# Patient Record
Sex: Female | Born: 1945 | Race: White | Hispanic: No | Marital: Married | Smoking: Never smoker
Health system: Southern US, Community
[De-identification: ages and names within clinical notes are randomized; demographics above are authoritative.]

## PROBLEM LIST (undated history)

## (undated) DIAGNOSIS — E785 Hyperlipidemia, unspecified: Secondary | ICD-10-CM

## (undated) HISTORY — DX: Hyperlipidemia, unspecified: E78.5

## (undated) HISTORY — PX: BACK SURGERY: SHX140

## (undated) HISTORY — PX: APPENDECTOMY: SHX54

---

## 1997-08-28 ENCOUNTER — Emergency Department (HOSPITAL_COMMUNITY): Admission: EM | Admit: 1997-08-28 | Discharge: 1997-08-28 | Payer: Self-pay | Admitting: Emergency Medicine

## 1997-10-05 ENCOUNTER — Other Ambulatory Visit: Admission: RE | Admit: 1997-10-05 | Discharge: 1997-10-05 | Payer: Self-pay | Admitting: Obstetrics and Gynecology

## 1999-01-24 ENCOUNTER — Encounter: Payer: Self-pay | Admitting: Internal Medicine

## 1999-01-24 ENCOUNTER — Encounter: Admission: RE | Admit: 1999-01-24 | Discharge: 1999-01-24 | Payer: Self-pay | Admitting: Internal Medicine

## 1999-04-14 ENCOUNTER — Other Ambulatory Visit: Admission: RE | Admit: 1999-04-14 | Discharge: 1999-04-14 | Payer: Self-pay | Admitting: Obstetrics and Gynecology

## 2000-06-12 ENCOUNTER — Ambulatory Visit (HOSPITAL_COMMUNITY): Admission: RE | Admit: 2000-06-12 | Discharge: 2000-06-12 | Payer: Self-pay | Admitting: Gastroenterology

## 2001-01-11 ENCOUNTER — Other Ambulatory Visit: Admission: RE | Admit: 2001-01-11 | Discharge: 2001-01-11 | Payer: Self-pay | Admitting: Obstetrics and Gynecology

## 2002-01-09 HISTORY — PX: OTHER SURGICAL HISTORY: SHX169

## 2004-03-04 ENCOUNTER — Ambulatory Visit: Payer: Self-pay

## 2005-02-09 ENCOUNTER — Encounter: Admission: RE | Admit: 2005-02-09 | Discharge: 2005-02-09 | Payer: Self-pay | Admitting: Orthopedic Surgery

## 2007-12-29 ENCOUNTER — Emergency Department (HOSPITAL_COMMUNITY): Admission: EM | Admit: 2007-12-29 | Discharge: 2007-12-29 | Payer: Self-pay | Admitting: Emergency Medicine

## 2011-02-15 ENCOUNTER — Ambulatory Visit (INDEPENDENT_AMBULATORY_CARE_PROVIDER_SITE_OTHER): Payer: BC Managed Care – PPO | Admitting: Cardiology

## 2011-02-15 ENCOUNTER — Encounter: Payer: Self-pay | Admitting: Cardiology

## 2011-02-15 DIAGNOSIS — I10 Essential (primary) hypertension: Secondary | ICD-10-CM | POA: Insufficient documentation

## 2011-02-15 DIAGNOSIS — E785 Hyperlipidemia, unspecified: Secondary | ICD-10-CM | POA: Insufficient documentation

## 2011-02-15 DIAGNOSIS — R011 Cardiac murmur, unspecified: Secondary | ICD-10-CM | POA: Insufficient documentation

## 2011-02-15 DIAGNOSIS — R079 Chest pain, unspecified: Secondary | ICD-10-CM | POA: Insufficient documentation

## 2011-02-15 MED ORDER — CHLORTHALIDONE 25 MG PO TABS: ORAL_TABLET | ORAL | Status: DC

## 2011-02-15 MED ORDER — POTASSIUM CHLORIDE CRYS ER 20 MEQ PO TBCR: 20.0000 meq | EXTENDED_RELEASE_TABLET | Freq: Every day | ORAL | Status: DC

## 2011-02-15 NOTE — Assessment & Plan Note (Addendum)
Patient has atypical chest pain, but she has a number of risk factors for CAD including strong family history of premature CAD, diabetes since age 66, HTN, and hyperlipidemia.  She also has been using premarin for years.  Given these factors, I think that she should have an ETT-myoview for risk stratification.  I will set this up today.  Continue ASA 81 mg daily.

## 2011-02-15 NOTE — Assessment & Plan Note (Signed)
BP quite high today, has been high at home.  Unable to increase losartan to 50 bid because of fatigue.  She is taking 25 mg qam and 50 mg qpm.  I am going to add chlorthalidone 12.5 mg daily + 20 mEq KCl to her regimen.  She will record her BP and I will reassess this when I see her in 2 wks.  Will get BMET in 2 wks.

## 2011-02-15 NOTE — Assessment & Plan Note (Signed)
Patient has an aortic area murmur.  It does not sound like severe AS.  She says she has been told for a long time that she has a murmur and apparently had an echo at some point in the past.  I cannot find one in the system.  I will get an echo to assess for significant valvular disease.

## 2011-02-15 NOTE — Patient Instructions (Addendum)
Start chlorthalidone 12.5mg  daily. This will be one-half 25mg  tablet daily.  Start KCL(potassium) 20 mEq daily.  Your physician has requested that you have en exercise stress myoview. For further information please visit [email or web address] Please follow instruction sheet, as given.   Your physician has requested that you have an echocardiogram. Echocardiography is a painless test that uses sound waves to create images of your heart. It provides your doctor with information about the size and shape of your heart and how well your heart's chambers and valves are working. This procedure takes approximately one hour. There are no restrictions for this procedure.  Take and record your blood pressure and heart rate daily. Bring these readings when you come back to see Dr Shirlee Latch in 2-3 weeks.  Your physician recommends that you schedule a follow-up appointment in: 2-3 weeks with Dr Shirlee Latch.

## 2011-02-15 NOTE — Progress Notes (Signed)
PCP: Dr. Jacky Kindle  66 yo with history of type I diabetes, HTN, hyperlipidemia presents for evaluation of chest pain and HTN.  Patient has had type I diabetes since she was 22.  She had atypical chest pain around 2000 and actually had a left heart cath at that time.  I am unable to find this in the medical record but she says that it was normal.  Starting in December, patient has been having episodes of lower central chest burning radiating to her back.  Episodes last about 5 minutes at at time.  They are not related to exertion and seem to track with stress.  She was having frequent episodes around Christmas when family was all in town.  She also reports left upper arm aching "like a toothache" that actually seems to be related to sleeping position.  She exercises with a trainer twice a week, doing treadmill and weights.  No exertional chest pain, no exertional dyspnea.    Blood pressure has been running high recently.  It is 193/85 today and has been systolic in the 150s when she measures it at home.  BP has been higher over the last couple of months.  She was told to increase losartan from 50 mg daily to 50 mg bid, but had significantly increased fatigue with higher losartan so is taking it 25 qam, 50 qpm.    ECG: NSR at 97, left atrial enlargement, Qs in V1 and V2 may be normal variant.   PMH: 1. Type I diabetes since age 83.  2. HTN: Cough with ACEI 3. Hyperlipidemia 4. GERD 5. C-spine arthritis 6. History of left heart cath around 2000.  Per patient's report, it was normal.   SH: Married, 2 children.  No smoking.  Occasional ETOH.  Lives in [place].   FH: Father with MI at 38, sister with CAD beginning late 11s, mother with CABG in mid-70s and CVA.   ROS: All systems reviewed and negative except as per HPI.   Current Outpatient Prescriptions  Medication Sig Dispense Refill  . aspirin 81 MG tablet Take 160 mg by mouth daily.      Marland Kitchen estrogens, conjugated, (PREMARIN) 0.625 MG tablet Take  0.625 mg by mouth daily. Take daily for 21 days then do not take for 7 days.      . insulin glargine (LANTUS) 100 UNIT/ML injection Inject into the skin at bedtime. 12-14 units in the morning      . insulin NPH (HUMULIN N,NOVOLIN N) 100 UNIT/ML injection Inject into the skin. 5 units every morning      . insulin regular (HUMULIN R) 100 units/mL injection Inject into the skin 3 (three) times daily before meals. 8-9 unints at night 1 u it all depends      . losartan (COZAAR) 50 MG tablet Take 50 mg by mouth daily. 1 and 1/2 tab daily      . Multiple Vitamin (MULTI-VITAMIN PO) Take by mouth daily.      . NON FORMULARY Glucagon Emergency Kit 1 MG kit      . simvastatin (ZOCOR) 20 MG tablet Take 20 mg by mouth every evening.      . chlorthalidone (HYGROTON) 25 MG tablet Take 1/2 tablet daily  30 tablet  3  . potassium chloride SA (K-DUR,KLOR-CON) 20 MEQ tablet Take 1 tablet (20 mEq total) by mouth daily.  30 tablet  6    BP 193/85  Pulse 102  Ht 5\' 2"  (1.575 m)  Wt 67.187 kg (148  lb 1.9 oz)  BMI 27.09 kg/m2 General: NAD Neck: No JVD, no thyromegaly or thyroid nodule.  Lungs: Clear to auscultation bilaterally with normal respiratory effort. CV: Nondisplaced PMI.  Heart regular S1/S2, no S3/S4, 2/6 SEM RUSB.  No peripheral edema.  No carotid bruit.  Normal pedal pulses.  Abdomen: Soft, nontender, no hepatosplenomegaly, no distention.  Skin: Intact without lesions or rashes.  Neurologic: Alert and oriented x 3.  Psych: Normal affect. Extremities: No clubbing or cyanosis.  HEENT: Normal.

## 2011-02-15 NOTE — Assessment & Plan Note (Signed)
Would aim for LDL < 100 ideally with her vascular disease risk.

## 2011-02-22 ENCOUNTER — Other Ambulatory Visit: Payer: Self-pay

## 2011-02-22 ENCOUNTER — Ambulatory Visit (HOSPITAL_COMMUNITY): Payer: BC Managed Care – PPO | Attending: Cardiovascular Disease | Admitting: Radiology

## 2011-02-22 DIAGNOSIS — I10 Essential (primary) hypertension: Secondary | ICD-10-CM | POA: Insufficient documentation

## 2011-02-22 DIAGNOSIS — Z8249 Family history of ischemic heart disease and other diseases of the circulatory system: Secondary | ICD-10-CM | POA: Insufficient documentation

## 2011-02-22 DIAGNOSIS — Z87891 Personal history of nicotine dependence: Secondary | ICD-10-CM | POA: Insufficient documentation

## 2011-02-22 DIAGNOSIS — E109 Type 1 diabetes mellitus without complications: Secondary | ICD-10-CM | POA: Insufficient documentation

## 2011-02-22 DIAGNOSIS — E785 Hyperlipidemia, unspecified: Secondary | ICD-10-CM | POA: Insufficient documentation

## 2011-02-22 DIAGNOSIS — M25519 Pain in unspecified shoulder: Secondary | ICD-10-CM | POA: Insufficient documentation

## 2011-02-22 DIAGNOSIS — R011 Cardiac murmur, unspecified: Secondary | ICD-10-CM | POA: Insufficient documentation

## 2011-03-01 ENCOUNTER — Ambulatory Visit (HOSPITAL_COMMUNITY): Payer: BC Managed Care – PPO | Attending: Cardiovascular Disease | Admitting: Radiology

## 2011-03-01 DIAGNOSIS — Z794 Long term (current) use of insulin: Secondary | ICD-10-CM | POA: Insufficient documentation

## 2011-03-01 DIAGNOSIS — I10 Essential (primary) hypertension: Secondary | ICD-10-CM | POA: Insufficient documentation

## 2011-03-01 DIAGNOSIS — E119 Type 2 diabetes mellitus without complications: Secondary | ICD-10-CM | POA: Insufficient documentation

## 2011-03-01 DIAGNOSIS — Z8249 Family history of ischemic heart disease and other diseases of the circulatory system: Secondary | ICD-10-CM | POA: Insufficient documentation

## 2011-03-01 DIAGNOSIS — R079 Chest pain, unspecified: Secondary | ICD-10-CM | POA: Insufficient documentation

## 2011-03-01 MED ORDER — TECHNETIUM TC 99M TETROFOSMIN IV KIT
30.0000 | PACK | Freq: Once | INTRAVENOUS | Status: AC | PRN
  Administered 2011-03-01: 30 via INTRAVENOUS

## 2011-03-01 MED ORDER — TECHNETIUM TC 99M TETROFOSMIN IV KIT
10.0000 | PACK | Freq: Once | INTRAVENOUS | Status: AC | PRN
  Administered 2011-03-01: 10 via INTRAVENOUS

## 2011-03-01 NOTE — Progress Notes (Signed)
[facility] SITE 3 NUCLEAR MED [address] [place] [address] [phone]  Cardiology Nuclear Med Study  Pamela Warner is a 66 y.o. female [id number] 1945-06-18   Nuclear Med Background Indication for Stress Test:  Evaluation for Ischemia History: '00 Heart Catheterization: NL per pt, '06 MPS: EF: 81% NL, 02/13 ECHO: EF: 55-60% Cardiac Risk Factors: Family History - CAD, Hypertension and IDDM Type 2  Symptoms:  Chest Pain   Nuclear Pre-Procedure Caffeine/Decaff Intake:  None NPO After: 7:30am   Lungs:  clear IV 0.9% NS with Angio Cath:  22g  IV Site: L Antecubital  IV Started by:  Stanton Kidney, EMT-P  Chest Size (in):  34 Cup Size: C  Height: 5\' 2"  (1.575 m)  Weight:  145 lb (65.772 kg)  BMI:  Body mass index is 26.52 kg/(m^2). Tech Comments:  Meds were taken as directed.    Nuclear Med Study 1 or 2 day study: 1 day  Stress Test Type:  Stress  Reading MD: Charlton Haws, MD  Order Authorizing Provider:  D.McLean  Resting Radionuclide: Technetium 15m Tetrofosmin  Resting Radionuclide Dose: 11.0 mCi   Stress Radionuclide:  Technetium 68m Tetrofosmin  Stress Radionuclide Dose: 33.0 mCi           Stress Protocol Rest HR: 75 Stress HR: 142  Rest BP: 152/58 Stress BP: 212/77  Exercise Time (min): 5:59 METS: 7.0   Predicted Max HR: 156 bpm % Max HR: 91.03 bpm Rate Pressure Product: 21308   Dose of Adenosine (mg):  n/a Dose of Lexiscan: n/a mg  Dose of Atropine (mg): n/a Dose of Dobutamine: n/a mcg/kg/min (at max HR)  Stress Test Technologist: Milana Na, EMT-P  Nuclear Technologist:  Doyne Keel, CNMT     Rest Procedure:  Myocardial perfusion imaging was performed at rest 45 minutes following the intravenous administration of Technetium 19m Tetrofosmin. Rest ECG: NSR  Stress Procedure:  The patient exercised for 5:59.  The patient stopped due to fatigue and denied any chest pain.  There were + significant ST-T wave changes.  Technetium  64m Tetrofosmin was injected at peak exercise and myocardial perfusion imaging was performed after a brief delay. Stress ECG: Markedly positive with 2 mm ST segment depression in inferolateral leads and T wave inversions in recovery  QPS Raw Data Images:  Normal; no motion artifact; normal heart/lung ratio. Stress Images:  Normal homogeneous uptake in all areas of the myocardium. Rest Images:  Normal homogeneous uptake in all areas of the myocardium. Subtraction (SDS):  Normal Transient Ischemic Dilatation (Normal <1.22):  1.04 Lung/Heart Ratio (Normal <0.45):  0.27  Quantitative Gated Spect Images QGS EDV:  51 ml QGS ESV:  13 ml QGS cine images:  NL LV Function; NL Wall Motion QGS EF: 73%  Impression Exercise Capacity:  Fair exercise capacity. BP Response:  Hypertensive blood pressure response. Clinical Symptoms:  No chest pain. ECG Impression:  Significant ST abnormalities consistent with ischemia. Comparison with Prior Nuclear Study: No images to compare  Overall Impression:  Normal nuclear images with markedly positive ECG changes with exercise.  EF normal 73%   Charlton Haws

## 2011-03-02 ENCOUNTER — Encounter: Payer: Self-pay | Admitting: Cardiology

## 2011-03-02 ENCOUNTER — Ambulatory Visit (INDEPENDENT_AMBULATORY_CARE_PROVIDER_SITE_OTHER): Payer: BC Managed Care – PPO | Admitting: Cardiology

## 2011-03-02 DIAGNOSIS — R011 Cardiac murmur, unspecified: Secondary | ICD-10-CM

## 2011-03-02 DIAGNOSIS — I10 Essential (primary) hypertension: Secondary | ICD-10-CM

## 2011-03-02 DIAGNOSIS — E785 Hyperlipidemia, unspecified: Secondary | ICD-10-CM

## 2011-03-02 DIAGNOSIS — R079 Chest pain, unspecified: Secondary | ICD-10-CM

## 2011-03-02 LAB — BASIC METABOLIC PANEL
BUN: 17 mg/dL (ref 6–23)
Chloride: 99 mEq/L (ref 96–112)
Potassium: 3.8 mEq/L (ref 3.5–5.1)
Sodium: 137 mEq/L (ref 135–145)

## 2011-03-02 MED ORDER — CHLORTHALIDONE 25 MG PO TABS: 25.0000 mg | ORAL_TABLET | Freq: Every day | ORAL | Status: DC

## 2011-03-02 NOTE — Progress Notes (Signed)
PCP: Dr. Jacky Kindle  66 yo with history of type I diabetes, HTN, hyperlipidemia presented for evaluation of chest pain and HTN.  Patient has had type I diabetes since she was 22.  She had atypical chest pain around 2000 and actually had a left heart cath at that time.  I am unable to find this in the medical record but she says that it was normal.  Starting in December, patient has been having episodes of lower central chest burning radiating to her back.  Episodes last about 5 minutes at at time.  They are not related to exertion and seem to track with stress.  She was having frequent episodes around Christmas when family was all in town.  She also reports left upper arm aching "like a toothache" that actually seems to be related to sleeping position.  She exercises with a trainer twice a week, doing treadmill and weights.  No exertional chest pain, no exertional dyspnea.    Given her very strong family history of premature CAD, I had her do an ETT-myoview.  The perfusion images showed no ischemia or infarction, but the ECG was markedly abnormal with ST depression.  No chest pain.    At last appointment, I started her on chlorthalidone 12.5 mg daily (had had trouble with side effects when she tried increasing losartan farther).  SBP is running in the 120s-140s at home.  BP is 175/84 today.   PMH: 1. Type I diabetes since age 76.  2. HTN: Cough with ACEI 3. Hyperlipidemia 4. GERD 5. C-spine arthritis 6. History of left heart cath around 2000.  Per patient's report, it was normal.  ETT-myoview (2/13) with 5'59" exercise, stopping due to fatigue (she says she could have gone further), no evidence for ischemia or infarction by perfusion images but markedly positive ECG changes with exercise.   7. Echo (2/13): EF 55-60%, grade I diastolic dysfunction, PA systolic pressure 32 mmHg.   SH: Married, 2 children.  No smoking.  Occasional ETOH.  Lives in [place].   FH: Father with MI at 69, sister with CAD  beginning late 61s, mother with CABG in mid-70s and CVA.   ROS: All systems reviewed and negative except as per HPI.   Current Outpatient Prescriptions  Medication Sig Dispense Refill  . aspirin 81 MG tablet Take 160 mg by mouth daily.      Marland Kitchen estrogens, conjugated, (PREMARIN) 0.625 MG tablet Take 0.625 mg by mouth daily. Take daily for 21 days then do not take for 7 days.      Marland Kitchen GLUCAGON EMERGENCY 1 MG injection as directed.      . insulin glargine (LANTUS) 100 UNIT/ML injection Inject into the skin at bedtime. 12-14 units in the morning      . insulin NPH (HUMULIN N,NOVOLIN N) 100 UNIT/ML injection Inject into the skin. 5 units every morning      . insulin regular (HUMULIN R) 100 units/mL injection Inject into the skin 3 (three) times daily before meals. 8-9 unints at night 1 u it all depends      . losartan (COZAAR) 50 MG tablet Take 50 mg by mouth daily. 1 and 1/2 tab daily      . Multiple Vitamin (MULTI-VITAMIN PO) Take by mouth daily.      . potassium chloride SA (K-DUR,KLOR-CON) 20 MEQ tablet Take 1 tablet (20 mEq total) by mouth daily.  30 tablet  6  . simvastatin (ZOCOR) 20 MG tablet Take 20 mg by mouth every evening.      Marland Kitchen  DISCONTD: chlorthalidone (HYGROTON) 25 MG tablet Take 1/2 tablet daily  30 tablet  3  . chlorthalidone (HYGROTON) 25 MG tablet Take 1 tablet (25 mg total) by mouth daily.  30 tablet  6    BP 175/84  Pulse 89  Ht 5\' 2"  (1.575 m)  Wt 147 lb (66.679 kg)  BMI 26.89 kg/m2 General: NAD Neck: No JVD, no thyromegaly or thyroid nodule.  Lungs: Clear to auscultation bilaterally with normal respiratory effort. CV: Nondisplaced PMI.  Heart regular S1/S2, no S3/S4, 1/6 SEM RUSB.  No peripheral edema.  No carotid bruit.  Normal pedal pulses.  Abdomen: Soft, nontender, no hepatosplenomegaly, no distention.  Neurologic: Alert and oriented x 3.  Psych: Normal affect. Extremities: No clubbing or cyanosis.

## 2011-03-02 NOTE — Patient Instructions (Signed)
Increase chlorthalidone to 25mg  daily.  Your physician recommends that you have lab work today--BMET   Your physician recommends that you return for a FASTING lipid profile/ BMET in 1-2 weeks.   Your physician wants you to follow-up in: 6 months with Dr Shirlee Latch. (August 2013). You will receive a reminder letter in the mail two months in advance. If you don't receive a letter, please call our office to schedule the follow-up appointment.

## 2011-03-02 NOTE — Assessment & Plan Note (Signed)
BP still running high.  Increase chlorthalidone to 25 mg daily with BMET in 2 weeks.

## 2011-03-02 NOTE — Assessment & Plan Note (Signed)
Suspect Pamela Warner's RUSB systolic murmur is a benign flow murmur.  Echo showed no significant valvular abnormality.

## 2011-03-02 NOTE — Assessment & Plan Note (Signed)
Check lipids, goal LDL at least < 100.

## 2011-03-02 NOTE — Assessment & Plan Note (Signed)
Patient has atypical chest pain, but she has a number of risk factors for CAD including strong family history of premature CAD, diabetes since age 66, HTN, and hyperlipidemia.  She also has been using premarin for years.  Given these factors, I had her get an ETT-myoview for risk stratification.  The perfusion images showed no evidence for ischemia or infarction but the ECG was suggestive of ischemia.  The myoview images should be more accurate, but given her risk factors, I am concerned about the abnormal ECG.  It is possible that this represents microvascular ischemia.  She is certainly at risk for this with her history of longstanding DM.  We had a long discussion today about what to do with this information.  A coronary CT angiogram to directly visualize the coronary arteries would be reasonable.  For now, we decided to continue aggressive management of risk factors given her lack of exertional symptoms.  She will continue ASA.  I would like to see her LDL at least < 100.  Finally, she needs good BP control.

## 2011-03-09 ENCOUNTER — Other Ambulatory Visit: Payer: BC Managed Care – PPO

## 2011-03-14 ENCOUNTER — Other Ambulatory Visit (INDEPENDENT_AMBULATORY_CARE_PROVIDER_SITE_OTHER): Payer: BC Managed Care – PPO

## 2011-03-14 ENCOUNTER — Telehealth: Payer: Self-pay | Admitting: *Deleted

## 2011-03-14 DIAGNOSIS — I10 Essential (primary) hypertension: Secondary | ICD-10-CM

## 2011-03-14 DIAGNOSIS — E876 Hypokalemia: Secondary | ICD-10-CM

## 2011-03-14 DIAGNOSIS — R079 Chest pain, unspecified: Secondary | ICD-10-CM

## 2011-03-14 LAB — BASIC METABOLIC PANEL
BUN: 14 mg/dL (ref 6–23)
Calcium: 9.3 mg/dL (ref 8.4–10.5)
Creatinine, Ser: 0.7 mg/dL (ref 0.4–1.2)
GFR: 87.93 mL/min (ref >60.00)
Glucose, Bld: 269 mg/dL — ABNORMAL HIGH (ref 70–99)

## 2011-03-14 LAB — LIPID PANEL
Cholesterol: 161 mg/dL (ref 0–200)
HDL: 65.5 mg/dL (ref >39.00)
LDL Cholesterol: 73 mg/dL (ref 0–99)
Triglycerides: 115 mg/dL (ref 0.0–149.0)
VLDL: 23 mg/dL (ref 0.0–40.0)

## 2011-03-14 MED ORDER — POTASSIUM CHLORIDE CRYS ER 20 MEQ PO TBCR: 20.0000 meq | EXTENDED_RELEASE_TABLET | Freq: Two times a day (BID) | ORAL | Status: DC

## 2011-03-14 NOTE — Telephone Encounter (Signed)
Notes Recorded by Jacqlyn Krauss, RN on 03/14/2011 at 6:23 PM Discussed with pt. She is currently taking KCL 20 mEq daily. She will increase KCL to 20 mEq bid. She will return for a BMET 02/28/11. ------  Notes Recorded by Marca Ancona, MD on 03/14/2011 at 5:33 PM Good lipids, increase KCl by 20 mEq daily and repeat BMET in 2 wks.

## 2011-03-28 ENCOUNTER — Other Ambulatory Visit: Payer: BC Managed Care – PPO

## 2011-04-28 ENCOUNTER — Other Ambulatory Visit: Payer: Self-pay | Admitting: *Deleted

## 2011-04-28 DIAGNOSIS — I10 Essential (primary) hypertension: Secondary | ICD-10-CM

## 2011-04-28 DIAGNOSIS — R079 Chest pain, unspecified: Secondary | ICD-10-CM

## 2011-04-28 MED ORDER — CHLORTHALIDONE 25 MG PO TABS: 25.0000 mg | ORAL_TABLET | Freq: Every day | ORAL | Status: DC

## 2011-04-28 NOTE — Telephone Encounter (Signed)
Refilled chlorthalidone

## 2011-05-15 ENCOUNTER — Other Ambulatory Visit: Payer: Self-pay | Admitting: *Deleted

## 2011-05-15 DIAGNOSIS — E876 Hypokalemia: Secondary | ICD-10-CM

## 2011-05-15 MED ORDER — POTASSIUM CHLORIDE CRYS ER 20 MEQ PO TBCR: 20.0000 meq | EXTENDED_RELEASE_TABLET | Freq: Two times a day (BID) | ORAL | Status: AC

## 2011-05-15 NOTE — Telephone Encounter (Signed)
Refilled potassium, for 90 day supply

## 2011-08-18 DIAGNOSIS — E785 Hyperlipidemia, unspecified: Secondary | ICD-10-CM | POA: Diagnosis not present

## 2011-08-18 DIAGNOSIS — I10 Essential (primary) hypertension: Secondary | ICD-10-CM | POA: Diagnosis not present

## 2011-08-18 DIAGNOSIS — K219 Gastro-esophageal reflux disease without esophagitis: Secondary | ICD-10-CM | POA: Diagnosis not present

## 2011-08-18 DIAGNOSIS — E109 Type 1 diabetes mellitus without complications: Secondary | ICD-10-CM | POA: Diagnosis not present

## 2011-08-24 DIAGNOSIS — Z803 Family history of malignant neoplasm of breast: Secondary | ICD-10-CM | POA: Diagnosis not present

## 2011-08-24 DIAGNOSIS — Z1231 Encounter for screening mammogram for malignant neoplasm of breast: Secondary | ICD-10-CM | POA: Diagnosis not present

## 2011-08-24 DIAGNOSIS — R928 Other abnormal and inconclusive findings on diagnostic imaging of breast: Secondary | ICD-10-CM | POA: Diagnosis not present

## 2011-08-28 DIAGNOSIS — R928 Other abnormal and inconclusive findings on diagnostic imaging of breast: Secondary | ICD-10-CM | POA: Diagnosis not present

## 2011-08-28 DIAGNOSIS — Z1231 Encounter for screening mammogram for malignant neoplasm of breast: Secondary | ICD-10-CM | POA: Diagnosis not present

## 2011-08-28 DIAGNOSIS — Z803 Family history of malignant neoplasm of breast: Secondary | ICD-10-CM | POA: Diagnosis not present

## 2011-09-14 DIAGNOSIS — L821 Other seborrheic keratosis: Secondary | ICD-10-CM | POA: Diagnosis not present

## 2011-09-14 DIAGNOSIS — T148 Other injury of unspecified body region: Secondary | ICD-10-CM | POA: Diagnosis not present

## 2011-09-14 DIAGNOSIS — W57XXXA Bitten or stung by nonvenomous insect and other nonvenomous arthropods, initial encounter: Secondary | ICD-10-CM | POA: Diagnosis not present

## 2011-10-17 DIAGNOSIS — I10 Essential (primary) hypertension: Secondary | ICD-10-CM | POA: Diagnosis not present

## 2011-11-09 ENCOUNTER — Telehealth: Payer: Self-pay | Admitting: Cardiology

## 2011-11-09 NOTE — Telephone Encounter (Signed)
New Problem:     I called the patient and was unable to reach them. I left a message on their voicemail with my name, the reason I called, the name of his physician, and a number to call back to schedule their appointment. 

## 2011-12-05 DIAGNOSIS — Z85828 Personal history of other malignant neoplasm of skin: Secondary | ICD-10-CM | POA: Diagnosis not present

## 2011-12-05 DIAGNOSIS — W57XXXA Bitten or stung by nonvenomous insect and other nonvenomous arthropods, initial encounter: Secondary | ICD-10-CM | POA: Diagnosis not present

## 2011-12-05 DIAGNOSIS — L821 Other seborrheic keratosis: Secondary | ICD-10-CM | POA: Diagnosis not present

## 2011-12-05 DIAGNOSIS — T148 Other injury of unspecified body region: Secondary | ICD-10-CM | POA: Diagnosis not present

## 2011-12-15 ENCOUNTER — Emergency Department (HOSPITAL_COMMUNITY)
Admission: EM | Admit: 2011-12-15 | Discharge: 2011-12-16 | Disposition: A | Discharge status: Home / Self Care | Payer: Medicare Other | Attending: Emergency Medicine | Admitting: Emergency Medicine

## 2011-12-15 ENCOUNTER — Emergency Department (HOSPITAL_COMMUNITY): Payer: Medicare Other

## 2011-12-15 ENCOUNTER — Encounter (HOSPITAL_COMMUNITY): Payer: Self-pay | Admitting: Emergency Medicine

## 2011-12-15 DIAGNOSIS — E785 Hyperlipidemia, unspecified: Secondary | ICD-10-CM | POA: Diagnosis not present

## 2011-12-15 DIAGNOSIS — Z7982 Long term (current) use of aspirin: Secondary | ICD-10-CM | POA: Insufficient documentation

## 2011-12-15 DIAGNOSIS — Z794 Long term (current) use of insulin: Secondary | ICD-10-CM | POA: Diagnosis not present

## 2011-12-15 DIAGNOSIS — I10 Essential (primary) hypertension: Secondary | ICD-10-CM | POA: Insufficient documentation

## 2011-12-15 DIAGNOSIS — Z79899 Other long term (current) drug therapy: Secondary | ICD-10-CM | POA: Diagnosis not present

## 2011-12-15 DIAGNOSIS — R112 Nausea with vomiting, unspecified: Secondary | ICD-10-CM | POA: Diagnosis not present

## 2011-12-15 DIAGNOSIS — E109 Type 1 diabetes mellitus without complications: Secondary | ICD-10-CM | POA: Diagnosis not present

## 2011-12-15 DIAGNOSIS — R51 Headache: Secondary | ICD-10-CM | POA: Insufficient documentation

## 2011-12-15 DIAGNOSIS — R1110 Vomiting, unspecified: Secondary | ICD-10-CM | POA: Diagnosis not present

## 2011-12-15 DIAGNOSIS — R52 Pain, unspecified: Secondary | ICD-10-CM | POA: Diagnosis not present

## 2011-12-15 LAB — BASIC METABOLIC PANEL
BUN: 16 mg/dL (ref 6–23)
CO2: 23 mEq/L (ref 19–32)
Calcium: 9.6 mg/dL (ref 8.4–10.5)
Chloride: 98 mEq/L (ref 96–112)
Creatinine, Ser: 0.6 mg/dL (ref 0.50–1.10)
GFR calc Af Amer: >90 mL/min (ref >90)
GFR calc non Af Amer: >90 mL/min (ref >90)
Glucose, Bld: 128 mg/dL — ABNORMAL HIGH (ref 70–99)
Potassium: 3.1 mEq/L — ABNORMAL LOW (ref 3.5–5.1)
Sodium: 135 mEq/L (ref 135–145)

## 2011-12-15 LAB — URINALYSIS, ROUTINE W REFLEX MICROSCOPIC
Bilirubin Urine: NEGATIVE
Glucose, UA: NEGATIVE mg/dL
Ketones, ur: 15 mg/dL — AB
Leukocytes, UA: NEGATIVE
Nitrite: NEGATIVE
Protein, ur: NEGATIVE mg/dL
Specific Gravity, Urine: 1.016 (ref 1.005–1.030)
Urobilinogen, UA: 0.2 mg/dL (ref 0.0–1.0)
pH: 8.5 — ABNORMAL HIGH (ref 5.0–8.0)

## 2011-12-15 LAB — CBC
HCT: 36.7 % (ref 36.0–46.0)
Hemoglobin: 12.7 g/dL (ref 12.0–15.0)
MCH: 29 pg (ref 26.0–34.0)
MCHC: 34.6 g/dL (ref 30.0–36.0)
MCV: 83.8 fL (ref 78.0–100.0)
Platelets: 270 10*3/uL (ref 150–400)
RBC: 4.38 MIL/uL (ref 3.87–5.11)
RDW: 11.7 % (ref 11.5–15.5)
WBC: 24.1 10*3/uL — ABNORMAL HIGH (ref 4.0–10.5)

## 2011-12-15 LAB — URINE MICROSCOPIC-ADD ON

## 2011-12-15 LAB — GLUCOSE, CAPILLARY: Glucose-Capillary: 74 mg/dL (ref 70–99)

## 2011-12-15 MED ORDER — SODIUM CHLORIDE 0.9 % IV BOLUS (SEPSIS)
1000.0000 mL | Freq: Once | INTRAVENOUS | Status: AC
  Administered 2011-12-15: 1000 mL via INTRAVENOUS

## 2011-12-15 MED ORDER — ONDANSETRON HCL 4 MG/2ML IJ SOLN
4.0000 mg | Freq: Once | INTRAMUSCULAR | Status: AC
Start: 2011-12-15 — End: 2011-12-15
  Administered 2011-12-15: 4 mg via INTRAVENOUS
  Filled 2011-12-15: qty 2

## 2011-12-15 MED ORDER — SODIUM BICARBONATE 8.4 % IV SOLN: 50.0000 meq | Freq: Once | INTRAVENOUS | Status: DC

## 2011-12-15 MED ORDER — HYDROMORPHONE HCL PF 1 MG/ML IJ SOLN
0.5000 mg | Freq: Once | INTRAMUSCULAR | Status: AC
  Administered 2011-12-15: 0.5 mg via INTRAVENOUS
  Filled 2011-12-15: qty 1

## 2011-12-15 MED ORDER — SODIUM CHLORIDE 0.9 % IV SOLN: 1.0000 g | Freq: Once | INTRAVENOUS | Status: DC

## 2011-12-15 NOTE — ED Notes (Signed)
Pt on stretcher in CT hallway resting with eyes closed, not yelling out, no crying. RN at bedside. Pt responds to verbal stimuli.

## 2011-12-15 NOTE — ED Notes (Signed)
Pt sitting up eating graham crackers and coke.  CBG 74 and pt states her blood sugar usually drops quickly.  Pt states she is feeling much better than before.  Rates pain 5/10.  Husband remains at bedside. LP consent signed and on chart.

## 2011-12-15 NOTE — ED Notes (Signed)
Pt sleeping after Dilaudid IV.  Easy to arouse. O2 sats dropped.  Pt placed on [place] at 2 liters/min.  Vitals WNL.  Family member at bedside.  Will continue to monitor.

## 2011-12-15 NOTE — ED Notes (Signed)
Splinting h/a. Husband called her and incoherent; rn friend  Told to give glucagon, and cbg afterwards was 104. When ems got there pt. Had vomiting, 10/10 h/a, given 4 mg zofran; 20 ga in lt. A/c. Nsr. ems cbg: 167. Stroke screen negative.

## 2011-12-15 NOTE — ED Provider Notes (Signed)
History    66 year old female with headache and altered mental status. Most of the history is obtained from the patient's husband. Husband last saw patient normal at approximately 1:15 this afternoon before he left to go hunting in [place]. He received call around 4:00 in the afternoon from the patient's friend who came to visit her. She stated that the patient seemed confused and was complaining of a headache. Patient has a history of insulin-dependent diabetes. Patient's husband instructed her friend to give pt a glucagon shot and he came home. He found his wife to be confused and complaining of a headache and vomiting.  No trauma that he is aware of. Patient with no significant headache history. She is not on blood thinning medications aside from 81 mg of aspirin daily. No fever. CBG for EMS and the 100s. Patient with no complaints aside from severe headache and vomiting. She denies any neck pain or neck stiffness. No numbness, tingling or loss of strength. No visual complaints.   CSN: [id number]  Arrival date & time 12/15/11  1821   First MD Initiated Contact with Patient 12/15/11 1823      Chief Complaint  Patient presents with  . Altered Mental Status  . Emesis    (Consider location/radiation/quality/duration/timing/severity/associated sxs/prior treatment) HPI  Past Medical History  Diagnosis Date  . Diabetes mellitus     type 1  . Essential hypertension   . Hyperlipidemia     Past Surgical History  Procedure Date  . Cesarean section     x2  . Appendectomy   . Other surgical history 2004    cardiolite    No family history on file.  History  Substance Use Topics  . Smoking status: Never Smoker   . Smokeless tobacco: Not on file  . Alcohol Use: Yes    OB History    Grav Para Term Preterm Abortions TAB SAB Ect Mult Living                  Review of Systems   Review of symptoms negative unless otherwise noted in HPI.   Allergies  Review of patient's  allergies indicates no known allergies.  Home Medications   Current Outpatient Rx  Name  Route  Sig  Dispense  Refill  . ASPIRIN 81 MG PO TABS   Oral   Take 160 mg by mouth daily.         . CHLORTHALIDONE 25 MG PO TABS   Oral   Take 1 tablet (25 mg total) by mouth daily.   90 tablet   3   . ESTROGENS CONJUGATED 0.625 MG PO TABS   Oral   Take 0.625 mg by mouth daily. Take daily for 21 days then do not take for 7 days.         Marland Kitchen GLUCAGON EMERGENCY 1 MG IJ KIT      as directed.         . INSULIN GLARGINE 100 UNIT/ML [place] SOLN   Subcutaneous   Inject into the skin at bedtime. 12-14 units in the morning         . INSULIN ISOPHANE HUMAN 100 UNIT/ML [place] SUSP   Subcutaneous   Inject into the skin. 5 units every morning         . INSULIN REGULAR HUMAN 100 UNIT/ML IJ SOLN   Subcutaneous   Inject into the skin 3 (three) times daily before meals. 8-9 unints at night 1 u it all depends         .  LOSARTAN POTASSIUM 50 MG PO TABS   Oral   Take 50 mg by mouth daily. 1 and 1/2 tab daily         . MULTI-VITAMIN PO   Oral   Take by mouth daily.         Marland Kitchen POTASSIUM CHLORIDE CRYS ER 20 MEQ PO TBCR   Oral   Take 1 tablet (20 mEq total) by mouth 2 (two) times daily.   180 tablet   3   . SIMVASTATIN 20 MG PO TABS   Oral   Take 20 mg by mouth every evening.           SpO2 100%  Physical Exam  Nursing note and vitals reviewed. Constitutional: She is oriented to person, place, and time. She appears well-developed and well-nourished.       Patient is sitting up in bed with her hand over her head. Dry heaving. Appears very uncomfortable.  HENT:  Head: Normocephalic and atraumatic.  Eyes: Conjunctivae normal are normal. Pupils are equal, round, and reactive to light. Right eye exhibits no discharge. Left eye exhibits no discharge.       Pupils 3 mm and reactive bilaterally.  Neck: Neck supple.       No nuchal rigidity  Cardiovascular: Normal rate, regular rhythm  and normal heart sounds.  Exam reveals no gallop and no friction rub.   No murmur heard. Pulmonary/Chest: Effort normal and breath sounds normal. No respiratory distress.  Abdominal: Soft. She exhibits no distension. There is no tenderness.  Musculoskeletal: She exhibits no edema and no tenderness.  Neurological: She is alert and oriented to person, place, and time. No cranial nerve deficit. She exhibits normal muscle tone. Coordination normal.       Patient alert and following commands. She does appear to have some difficulty with concentration because of the degree of pain she seems to be in.  Skin: Skin is warm and dry.  Psychiatric: She has a normal mood and affect. Her behavior is normal. Thought content normal.    ED Course  Procedures (including critical care time)  Labs Reviewed  CBC - Abnormal; Notable for the following:    WBC 24.1 (*)     All other components within normal limits  BASIC METABOLIC PANEL - Abnormal; Notable for the following:    Potassium 3.1 (*)     Glucose, Bld 128 (*)     All other components within normal limits  URINALYSIS, ROUTINE W REFLEX MICROSCOPIC - Abnormal; Notable for the following:    APPearance CLOUDY (*)     pH 8.5 (*)     Hgb urine dipstick TRACE (*)     Ketones, ur 15 (*)     All other components within normal limits  URINE MICROSCOPIC-ADD ON - Abnormal; Notable for the following:    Squamous Epithelial / LPF FEW (*)     All other components within normal limits  GLUCOSE, CAPILLARY  LAB REPORT - SCANNED  CSF CULTURE  GRAM STAIN   Ct Head Wo Contrast  12/15/2011  *RADIOLOGY REPORT*  Clinical Data: 66 year old female with severe headache and vomiting.  CT HEAD WITHOUT CONTRAST  Technique:  Contiguous axial images were obtained from the base of the skull through the vertex without contrast.  Comparison: None  Findings: No intracranial abnormalities are identified, including mass lesion or mass effect, hydrocephalus, extra-axial fluid  collection, midline shift, hemorrhage, or acute infarction.  The visualized bony calvarium is unremarkable.  IMPRESSION: Unremarkable noncontrast head  CT.   Original Report Authenticated By: Harmon Pier, M.D.      1. Headache       MDM  66 year old female with what sounds like relatively acute onset headache. Vomiting. Altered per her husband. Concern for possible subarachnoid hemorrhage. Stat head CT. There is no reported trauma.   9:02 PM Discussed lumbar puncture with patient and her husband. CT negative for subarachnoid hemorrhage but explained that CT could potentially miss a small bleed and LP would also evaluate for possible meningitis, although I feel this is not likely. Consent obtained. Procedure to be done by Dr. Freida Busman under my supervision.  LP attempted by resident, but pt not cooperative and aborted. Pt feeling much better though. CT done within 8 hours of last seen normal and no evidence of bleeding. Pt back to baseline MS, no continued vomiting and HA much improved. Feel safe for DC Return precautions discussed.     Raeford Razor, MD 12/17/11 1730

## 2011-12-16 NOTE — ED Notes (Addendum)
Pt states she is ready to go home and does not want to wait any longer for LP.  Pt given ginger ale. Apologized for delay and asked if she wanted me to check with MD again regarding approximate wait time for procedure.  Pt agreed and updated on plan for MD.  Resident to bedside for procedure.  Offered husband something to drink and he declined. Pain 5/10.  Pt states she still feels much better than before. Nausea relieved.

## 2012-01-31 DIAGNOSIS — E785 Hyperlipidemia, unspecified: Secondary | ICD-10-CM | POA: Diagnosis not present

## 2012-01-31 DIAGNOSIS — E109 Type 1 diabetes mellitus without complications: Secondary | ICD-10-CM | POA: Diagnosis not present

## 2012-01-31 DIAGNOSIS — I10 Essential (primary) hypertension: Secondary | ICD-10-CM | POA: Diagnosis not present

## 2012-02-07 DIAGNOSIS — E109 Type 1 diabetes mellitus without complications: Secondary | ICD-10-CM | POA: Diagnosis not present

## 2012-02-07 DIAGNOSIS — Z1331 Encounter for screening for depression: Secondary | ICD-10-CM | POA: Diagnosis not present

## 2012-02-07 DIAGNOSIS — I10 Essential (primary) hypertension: Secondary | ICD-10-CM | POA: Diagnosis not present

## 2012-02-07 DIAGNOSIS — E785 Hyperlipidemia, unspecified: Secondary | ICD-10-CM | POA: Diagnosis not present

## 2012-02-07 DIAGNOSIS — Z0000 Encounter for general adult medical examination without abnormal findings: Secondary | ICD-10-CM | POA: Diagnosis not present

## 2012-02-08 ENCOUNTER — Ambulatory Visit (INDEPENDENT_AMBULATORY_CARE_PROVIDER_SITE_OTHER): Payer: Medicare Other | Admitting: Vascular Surgery

## 2012-02-08 DIAGNOSIS — R0989 Other specified symptoms and signs involving the circulatory and respiratory systems: Secondary | ICD-10-CM

## 2012-02-08 NOTE — Progress Notes (Signed)
Carotid duplex performed @ [facility] 02/08/2012

## 2012-02-13 DIAGNOSIS — Z1212 Encounter for screening for malignant neoplasm of rectum: Secondary | ICD-10-CM | POA: Diagnosis not present

## 2012-03-05 DIAGNOSIS — R928 Other abnormal and inconclusive findings on diagnostic imaging of breast: Secondary | ICD-10-CM | POA: Diagnosis not present

## 2012-07-02 DIAGNOSIS — E1139 Type 2 diabetes mellitus with other diabetic ophthalmic complication: Secondary | ICD-10-CM | POA: Diagnosis not present

## 2012-07-02 DIAGNOSIS — E11329 Type 2 diabetes mellitus with mild nonproliferative diabetic retinopathy without macular edema: Secondary | ICD-10-CM | POA: Diagnosis not present

## 2012-07-02 DIAGNOSIS — H25019 Cortical age-related cataract, unspecified eye: Secondary | ICD-10-CM | POA: Diagnosis not present

## 2012-08-05 DIAGNOSIS — E109 Type 1 diabetes mellitus without complications: Secondary | ICD-10-CM | POA: Diagnosis not present

## 2012-08-05 DIAGNOSIS — I10 Essential (primary) hypertension: Secondary | ICD-10-CM | POA: Diagnosis not present

## 2012-08-05 DIAGNOSIS — K219 Gastro-esophageal reflux disease without esophagitis: Secondary | ICD-10-CM | POA: Diagnosis not present

## 2012-08-05 DIAGNOSIS — E785 Hyperlipidemia, unspecified: Secondary | ICD-10-CM | POA: Diagnosis not present

## 2012-08-07 DIAGNOSIS — E1139 Type 2 diabetes mellitus with other diabetic ophthalmic complication: Secondary | ICD-10-CM | POA: Diagnosis not present

## 2012-08-07 DIAGNOSIS — E1039 Type 1 diabetes mellitus with other diabetic ophthalmic complication: Secondary | ICD-10-CM | POA: Diagnosis not present

## 2012-08-07 DIAGNOSIS — E11329 Type 2 diabetes mellitus with mild nonproliferative diabetic retinopathy without macular edema: Secondary | ICD-10-CM | POA: Diagnosis not present

## 2012-08-07 DIAGNOSIS — E11311 Type 2 diabetes mellitus with unspecified diabetic retinopathy with macular edema: Secondary | ICD-10-CM | POA: Diagnosis not present

## 2012-08-07 DIAGNOSIS — H25019 Cortical age-related cataract, unspecified eye: Secondary | ICD-10-CM | POA: Diagnosis not present

## 2012-08-12 DIAGNOSIS — Z1231 Encounter for screening mammogram for malignant neoplasm of breast: Secondary | ICD-10-CM | POA: Diagnosis not present

## 2012-08-14 DIAGNOSIS — H269 Unspecified cataract: Secondary | ICD-10-CM | POA: Diagnosis not present

## 2012-08-14 DIAGNOSIS — H18519 Endothelial corneal dystrophy, unspecified eye: Secondary | ICD-10-CM | POA: Diagnosis not present

## 2012-08-14 DIAGNOSIS — H2510 Age-related nuclear cataract, unspecified eye: Secondary | ICD-10-CM | POA: Diagnosis not present

## 2012-08-14 DIAGNOSIS — E1039 Type 1 diabetes mellitus with other diabetic ophthalmic complication: Secondary | ICD-10-CM | POA: Diagnosis not present

## 2012-10-09 DIAGNOSIS — E1339 Other specified diabetes mellitus with other diabetic ophthalmic complication: Secondary | ICD-10-CM | POA: Diagnosis not present

## 2012-10-09 DIAGNOSIS — E11329 Type 2 diabetes mellitus with mild nonproliferative diabetic retinopathy without macular edema: Secondary | ICD-10-CM | POA: Diagnosis not present

## 2012-10-09 DIAGNOSIS — H25019 Cortical age-related cataract, unspecified eye: Secondary | ICD-10-CM | POA: Diagnosis not present

## 2012-10-09 DIAGNOSIS — E11359 Type 2 diabetes mellitus with proliferative diabetic retinopathy without macular edema: Secondary | ICD-10-CM | POA: Diagnosis not present

## 2012-10-09 DIAGNOSIS — E1039 Type 1 diabetes mellitus with other diabetic ophthalmic complication: Secondary | ICD-10-CM | POA: Diagnosis not present

## 2012-10-09 DIAGNOSIS — E11311 Type 2 diabetes mellitus with unspecified diabetic retinopathy with macular edema: Secondary | ICD-10-CM | POA: Diagnosis not present

## 2012-10-14 DIAGNOSIS — M9981 Other biomechanical lesions of cervical region: Secondary | ICD-10-CM | POA: Diagnosis not present

## 2012-10-14 DIAGNOSIS — M62838 Other muscle spasm: Secondary | ICD-10-CM | POA: Diagnosis not present

## 2012-10-16 DIAGNOSIS — M9981 Other biomechanical lesions of cervical region: Secondary | ICD-10-CM | POA: Diagnosis not present

## 2012-10-16 DIAGNOSIS — M62838 Other muscle spasm: Secondary | ICD-10-CM | POA: Diagnosis not present

## 2012-10-21 DIAGNOSIS — M62838 Other muscle spasm: Secondary | ICD-10-CM | POA: Diagnosis not present

## 2012-10-21 DIAGNOSIS — M9981 Other biomechanical lesions of cervical region: Secondary | ICD-10-CM | POA: Diagnosis not present

## 2012-10-30 DIAGNOSIS — M62838 Other muscle spasm: Secondary | ICD-10-CM | POA: Diagnosis not present

## 2012-10-30 DIAGNOSIS — M9981 Other biomechanical lesions of cervical region: Secondary | ICD-10-CM | POA: Diagnosis not present

## 2012-11-13 DIAGNOSIS — M9981 Other biomechanical lesions of cervical region: Secondary | ICD-10-CM | POA: Diagnosis not present

## 2012-11-13 DIAGNOSIS — M62838 Other muscle spasm: Secondary | ICD-10-CM | POA: Diagnosis not present

## 2012-11-20 DIAGNOSIS — H211X9 Other vascular disorders of iris and ciliary body, unspecified eye: Secondary | ICD-10-CM | POA: Insufficient documentation

## 2012-11-20 DIAGNOSIS — E11359 Type 2 diabetes mellitus with proliferative diabetic retinopathy without macular edema: Secondary | ICD-10-CM | POA: Diagnosis not present

## 2012-11-20 DIAGNOSIS — E11319 Type 2 diabetes mellitus with unspecified diabetic retinopathy without macular edema: Secondary | ICD-10-CM | POA: Diagnosis not present

## 2012-11-20 DIAGNOSIS — E11311 Type 2 diabetes mellitus with unspecified diabetic retinopathy with macular edema: Secondary | ICD-10-CM | POA: Diagnosis not present

## 2012-11-20 DIAGNOSIS — E119 Type 2 diabetes mellitus without complications: Secondary | ICD-10-CM | POA: Diagnosis not present

## 2012-11-20 DIAGNOSIS — E1139 Type 2 diabetes mellitus with other diabetic ophthalmic complication: Secondary | ICD-10-CM | POA: Diagnosis not present

## 2012-11-20 DIAGNOSIS — H25019 Cortical age-related cataract, unspecified eye: Secondary | ICD-10-CM | POA: Diagnosis not present

## 2012-12-04 DIAGNOSIS — E11359 Type 2 diabetes mellitus with proliferative diabetic retinopathy without macular edema: Secondary | ICD-10-CM | POA: Diagnosis not present

## 2012-12-04 DIAGNOSIS — H211X9 Other vascular disorders of iris and ciliary body, unspecified eye: Secondary | ICD-10-CM | POA: Diagnosis not present

## 2012-12-04 DIAGNOSIS — E11311 Type 2 diabetes mellitus with unspecified diabetic retinopathy with macular edema: Secondary | ICD-10-CM | POA: Diagnosis not present

## 2012-12-04 DIAGNOSIS — E1139 Type 2 diabetes mellitus with other diabetic ophthalmic complication: Secondary | ICD-10-CM | POA: Diagnosis not present

## 2012-12-04 DIAGNOSIS — E119 Type 2 diabetes mellitus without complications: Secondary | ICD-10-CM | POA: Diagnosis not present

## 2013-02-05 DIAGNOSIS — E1139 Type 2 diabetes mellitus with other diabetic ophthalmic complication: Secondary | ICD-10-CM | POA: Diagnosis not present

## 2013-02-05 DIAGNOSIS — I10 Essential (primary) hypertension: Secondary | ICD-10-CM | POA: Diagnosis not present

## 2013-02-05 DIAGNOSIS — H25019 Cortical age-related cataract, unspecified eye: Secondary | ICD-10-CM | POA: Diagnosis not present

## 2013-02-05 DIAGNOSIS — E785 Hyperlipidemia, unspecified: Secondary | ICD-10-CM | POA: Diagnosis not present

## 2013-02-05 DIAGNOSIS — H4310 Vitreous hemorrhage, unspecified eye: Secondary | ICD-10-CM | POA: Diagnosis not present

## 2013-02-05 DIAGNOSIS — E11311 Type 2 diabetes mellitus with unspecified diabetic retinopathy with macular edema: Secondary | ICD-10-CM | POA: Diagnosis not present

## 2013-02-05 DIAGNOSIS — E109 Type 1 diabetes mellitus without complications: Secondary | ICD-10-CM | POA: Diagnosis not present

## 2013-02-05 DIAGNOSIS — E11359 Type 2 diabetes mellitus with proliferative diabetic retinopathy without macular edema: Secondary | ICD-10-CM | POA: Diagnosis not present

## 2013-02-10 DIAGNOSIS — E1039 Type 1 diabetes mellitus with other diabetic ophthalmic complication: Secondary | ICD-10-CM | POA: Diagnosis not present

## 2013-02-10 DIAGNOSIS — I10 Essential (primary) hypertension: Secondary | ICD-10-CM | POA: Diagnosis not present

## 2013-02-10 DIAGNOSIS — E11319 Type 2 diabetes mellitus with unspecified diabetic retinopathy without macular edema: Secondary | ICD-10-CM | POA: Diagnosis not present

## 2013-02-10 DIAGNOSIS — Z1331 Encounter for screening for depression: Secondary | ICD-10-CM | POA: Diagnosis not present

## 2013-02-10 DIAGNOSIS — Z0000 Encounter for general adult medical examination without abnormal findings: Secondary | ICD-10-CM | POA: Diagnosis not present

## 2013-02-10 DIAGNOSIS — Z6826 Body mass index (BMI) 26.0-26.9, adult: Secondary | ICD-10-CM | POA: Diagnosis not present

## 2013-02-10 DIAGNOSIS — E785 Hyperlipidemia, unspecified: Secondary | ICD-10-CM | POA: Diagnosis not present

## 2013-02-10 DIAGNOSIS — Z1212 Encounter for screening for malignant neoplasm of rectum: Secondary | ICD-10-CM | POA: Diagnosis not present

## 2013-02-11 DIAGNOSIS — H2510 Age-related nuclear cataract, unspecified eye: Secondary | ICD-10-CM | POA: Diagnosis not present

## 2013-02-26 DIAGNOSIS — E11311 Type 2 diabetes mellitus with unspecified diabetic retinopathy with macular edema: Secondary | ICD-10-CM | POA: Diagnosis not present

## 2013-02-26 DIAGNOSIS — E1339 Other specified diabetes mellitus with other diabetic ophthalmic complication: Secondary | ICD-10-CM | POA: Diagnosis not present

## 2013-02-26 DIAGNOSIS — E1139 Type 2 diabetes mellitus with other diabetic ophthalmic complication: Secondary | ICD-10-CM | POA: Diagnosis not present

## 2013-02-26 DIAGNOSIS — E11359 Type 2 diabetes mellitus with proliferative diabetic retinopathy without macular edema: Secondary | ICD-10-CM | POA: Diagnosis not present

## 2013-03-04 DIAGNOSIS — H2510 Age-related nuclear cataract, unspecified eye: Secondary | ICD-10-CM | POA: Diagnosis not present

## 2013-04-23 DIAGNOSIS — E119 Type 2 diabetes mellitus without complications: Secondary | ICD-10-CM | POA: Diagnosis not present

## 2013-04-23 DIAGNOSIS — E1139 Type 2 diabetes mellitus with other diabetic ophthalmic complication: Secondary | ICD-10-CM | POA: Diagnosis not present

## 2013-04-23 DIAGNOSIS — H4310 Vitreous hemorrhage, unspecified eye: Secondary | ICD-10-CM | POA: Diagnosis not present

## 2013-04-23 DIAGNOSIS — H211X9 Other vascular disorders of iris and ciliary body, unspecified eye: Secondary | ICD-10-CM | POA: Diagnosis not present

## 2013-04-23 DIAGNOSIS — E11359 Type 2 diabetes mellitus with proliferative diabetic retinopathy without macular edema: Secondary | ICD-10-CM | POA: Diagnosis not present

## 2013-05-16 DIAGNOSIS — E1136 Type 2 diabetes mellitus with diabetic cataract: Secondary | ICD-10-CM | POA: Diagnosis not present

## 2013-05-16 DIAGNOSIS — E1339 Other specified diabetes mellitus with other diabetic ophthalmic complication: Secondary | ICD-10-CM | POA: Diagnosis not present

## 2013-05-16 DIAGNOSIS — E11319 Type 2 diabetes mellitus with unspecified diabetic retinopathy without macular edema: Secondary | ICD-10-CM | POA: Diagnosis not present

## 2013-05-16 DIAGNOSIS — H2510 Age-related nuclear cataract, unspecified eye: Secondary | ICD-10-CM | POA: Diagnosis not present

## 2013-05-16 DIAGNOSIS — H18519 Endothelial corneal dystrophy, unspecified eye: Secondary | ICD-10-CM | POA: Diagnosis not present

## 2013-05-16 DIAGNOSIS — H269 Unspecified cataract: Secondary | ICD-10-CM | POA: Diagnosis not present

## 2013-05-16 DIAGNOSIS — E11311 Type 2 diabetes mellitus with unspecified diabetic retinopathy with macular edema: Secondary | ICD-10-CM | POA: Diagnosis not present

## 2013-05-16 DIAGNOSIS — E1139 Type 2 diabetes mellitus with other diabetic ophthalmic complication: Secondary | ICD-10-CM | POA: Diagnosis not present

## 2013-06-16 DIAGNOSIS — Z01818 Encounter for other preprocedural examination: Secondary | ICD-10-CM | POA: Diagnosis not present

## 2013-06-19 DIAGNOSIS — E11359 Type 2 diabetes mellitus with proliferative diabetic retinopathy without macular edema: Secondary | ICD-10-CM | POA: Diagnosis not present

## 2013-06-19 DIAGNOSIS — E785 Hyperlipidemia, unspecified: Secondary | ICD-10-CM | POA: Diagnosis not present

## 2013-06-19 DIAGNOSIS — H35429 Microcystoid degeneration of retina, unspecified eye: Secondary | ICD-10-CM | POA: Diagnosis not present

## 2013-06-19 DIAGNOSIS — I10 Essential (primary) hypertension: Secondary | ICD-10-CM | POA: Diagnosis not present

## 2013-06-19 DIAGNOSIS — Z794 Long term (current) use of insulin: Secondary | ICD-10-CM | POA: Diagnosis not present

## 2013-06-19 DIAGNOSIS — H2510 Age-related nuclear cataract, unspecified eye: Secondary | ICD-10-CM | POA: Diagnosis not present

## 2013-06-19 DIAGNOSIS — K219 Gastro-esophageal reflux disease without esophagitis: Secondary | ICD-10-CM | POA: Diagnosis not present

## 2013-06-19 DIAGNOSIS — Z79899 Other long term (current) drug therapy: Secondary | ICD-10-CM | POA: Diagnosis not present

## 2013-06-19 DIAGNOSIS — E11319 Type 2 diabetes mellitus with unspecified diabetic retinopathy without macular edema: Secondary | ICD-10-CM | POA: Diagnosis not present

## 2013-06-19 DIAGNOSIS — H18519 Endothelial corneal dystrophy, unspecified eye: Secondary | ICD-10-CM | POA: Diagnosis not present

## 2013-06-19 DIAGNOSIS — E1139 Type 2 diabetes mellitus with other diabetic ophthalmic complication: Secondary | ICD-10-CM | POA: Diagnosis not present

## 2013-06-19 DIAGNOSIS — H4310 Vitreous hemorrhage, unspecified eye: Secondary | ICD-10-CM | POA: Diagnosis not present

## 2013-06-20 DIAGNOSIS — Z961 Presence of intraocular lens: Secondary | ICD-10-CM | POA: Insufficient documentation

## 2013-07-16 DIAGNOSIS — E1139 Type 2 diabetes mellitus with other diabetic ophthalmic complication: Secondary | ICD-10-CM | POA: Diagnosis not present

## 2013-07-16 DIAGNOSIS — E1339 Other specified diabetes mellitus with other diabetic ophthalmic complication: Secondary | ICD-10-CM | POA: Diagnosis not present

## 2013-07-16 DIAGNOSIS — E11311 Type 2 diabetes mellitus with unspecified diabetic retinopathy with macular edema: Secondary | ICD-10-CM | POA: Diagnosis not present

## 2013-07-16 DIAGNOSIS — E11319 Type 2 diabetes mellitus with unspecified diabetic retinopathy without macular edema: Secondary | ICD-10-CM | POA: Diagnosis not present

## 2013-07-16 DIAGNOSIS — E11359 Type 2 diabetes mellitus with proliferative diabetic retinopathy without macular edema: Secondary | ICD-10-CM | POA: Diagnosis not present

## 2013-08-13 DIAGNOSIS — Z1231 Encounter for screening mammogram for malignant neoplasm of breast: Secondary | ICD-10-CM | POA: Diagnosis not present

## 2013-08-13 DIAGNOSIS — Z803 Family history of malignant neoplasm of breast: Secondary | ICD-10-CM | POA: Diagnosis not present

## 2013-08-14 DIAGNOSIS — Z6827 Body mass index (BMI) 27.0-27.9, adult: Secondary | ICD-10-CM | POA: Diagnosis not present

## 2013-08-14 DIAGNOSIS — I10 Essential (primary) hypertension: Secondary | ICD-10-CM | POA: Diagnosis not present

## 2013-08-14 DIAGNOSIS — E1039 Type 1 diabetes mellitus with other diabetic ophthalmic complication: Secondary | ICD-10-CM | POA: Diagnosis not present

## 2013-08-14 DIAGNOSIS — E11319 Type 2 diabetes mellitus with unspecified diabetic retinopathy without macular edema: Secondary | ICD-10-CM | POA: Diagnosis not present

## 2013-08-14 DIAGNOSIS — K219 Gastro-esophageal reflux disease without esophagitis: Secondary | ICD-10-CM | POA: Diagnosis not present

## 2013-08-14 DIAGNOSIS — E785 Hyperlipidemia, unspecified: Secondary | ICD-10-CM | POA: Diagnosis not present

## 2013-09-10 DIAGNOSIS — H4310 Vitreous hemorrhage, unspecified eye: Secondary | ICD-10-CM | POA: Diagnosis not present

## 2013-09-10 DIAGNOSIS — H211X9 Other vascular disorders of iris and ciliary body, unspecified eye: Secondary | ICD-10-CM | POA: Diagnosis not present

## 2013-09-10 DIAGNOSIS — E11311 Type 2 diabetes mellitus with unspecified diabetic retinopathy with macular edema: Secondary | ICD-10-CM | POA: Diagnosis not present

## 2013-09-10 DIAGNOSIS — E1339 Other specified diabetes mellitus with other diabetic ophthalmic complication: Secondary | ICD-10-CM | POA: Diagnosis not present

## 2013-09-10 DIAGNOSIS — E1139 Type 2 diabetes mellitus with other diabetic ophthalmic complication: Secondary | ICD-10-CM | POA: Diagnosis not present

## 2013-09-10 DIAGNOSIS — Z961 Presence of intraocular lens: Secondary | ICD-10-CM | POA: Diagnosis not present

## 2013-09-10 DIAGNOSIS — E11359 Type 2 diabetes mellitus with proliferative diabetic retinopathy without macular edema: Secondary | ICD-10-CM | POA: Diagnosis not present

## 2013-09-10 DIAGNOSIS — H18519 Endothelial corneal dystrophy, unspecified eye: Secondary | ICD-10-CM | POA: Diagnosis not present

## 2013-09-10 DIAGNOSIS — E11319 Type 2 diabetes mellitus with unspecified diabetic retinopathy without macular edema: Secondary | ICD-10-CM | POA: Diagnosis not present

## 2013-10-01 DIAGNOSIS — E11359 Type 2 diabetes mellitus with proliferative diabetic retinopathy without macular edema: Secondary | ICD-10-CM | POA: Diagnosis not present

## 2013-10-01 DIAGNOSIS — H40059 Ocular hypertension, unspecified eye: Secondary | ICD-10-CM | POA: Diagnosis not present

## 2013-10-01 DIAGNOSIS — H211X9 Other vascular disorders of iris and ciliary body, unspecified eye: Secondary | ICD-10-CM | POA: Diagnosis not present

## 2013-10-01 DIAGNOSIS — E1339 Other specified diabetes mellitus with other diabetic ophthalmic complication: Secondary | ICD-10-CM | POA: Diagnosis not present

## 2013-10-17 DIAGNOSIS — H1851 Endothelial corneal dystrophy: Secondary | ICD-10-CM | POA: Diagnosis not present

## 2013-10-17 DIAGNOSIS — H209 Unspecified iridocyclitis: Secondary | ICD-10-CM | POA: Insufficient documentation

## 2013-10-17 DIAGNOSIS — E08351 Diabetes mellitus due to underlying condition with proliferative diabetic retinopathy with macular edema: Secondary | ICD-10-CM | POA: Diagnosis not present

## 2013-10-22 DIAGNOSIS — E11311 Type 2 diabetes mellitus with unspecified diabetic retinopathy with macular edema: Secondary | ICD-10-CM | POA: Diagnosis not present

## 2013-10-22 DIAGNOSIS — E11351 Type 2 diabetes mellitus with proliferative diabetic retinopathy with macular edema: Secondary | ICD-10-CM | POA: Diagnosis not present

## 2013-10-22 DIAGNOSIS — H209 Unspecified iridocyclitis: Secondary | ICD-10-CM | POA: Diagnosis not present

## 2013-10-22 DIAGNOSIS — H211X3 Other vascular disorders of iris and ciliary body, bilateral: Secondary | ICD-10-CM | POA: Diagnosis not present

## 2013-10-22 DIAGNOSIS — H4312 Vitreous hemorrhage, left eye: Secondary | ICD-10-CM | POA: Diagnosis not present

## 2013-11-05 DIAGNOSIS — Z961 Presence of intraocular lens: Secondary | ICD-10-CM | POA: Diagnosis not present

## 2013-11-05 DIAGNOSIS — H4312 Vitreous hemorrhage, left eye: Secondary | ICD-10-CM | POA: Diagnosis not present

## 2013-11-05 DIAGNOSIS — E11351 Type 2 diabetes mellitus with proliferative diabetic retinopathy with macular edema: Secondary | ICD-10-CM | POA: Diagnosis not present

## 2013-11-05 DIAGNOSIS — H1851 Endothelial corneal dystrophy: Secondary | ICD-10-CM | POA: Diagnosis not present

## 2013-11-05 DIAGNOSIS — H209 Unspecified iridocyclitis: Secondary | ICD-10-CM | POA: Diagnosis not present

## 2013-11-05 DIAGNOSIS — E11311 Type 2 diabetes mellitus with unspecified diabetic retinopathy with macular edema: Secondary | ICD-10-CM | POA: Diagnosis not present

## 2013-11-05 DIAGNOSIS — H211X3 Other vascular disorders of iris and ciliary body, bilateral: Secondary | ICD-10-CM | POA: Diagnosis not present

## 2013-11-14 DIAGNOSIS — E1039 Type 1 diabetes mellitus with other diabetic ophthalmic complication: Secondary | ICD-10-CM | POA: Diagnosis not present

## 2013-11-14 DIAGNOSIS — E785 Hyperlipidemia, unspecified: Secondary | ICD-10-CM | POA: Diagnosis not present

## 2013-11-14 DIAGNOSIS — I10 Essential (primary) hypertension: Secondary | ICD-10-CM | POA: Diagnosis not present

## 2013-11-14 DIAGNOSIS — E11319 Type 2 diabetes mellitus with unspecified diabetic retinopathy without macular edema: Secondary | ICD-10-CM | POA: Diagnosis not present

## 2013-11-26 DIAGNOSIS — E10359 Type 1 diabetes mellitus with proliferative diabetic retinopathy without macular edema: Secondary | ICD-10-CM | POA: Diagnosis not present

## 2013-11-26 DIAGNOSIS — H211X2 Other vascular disorders of iris and ciliary body, left eye: Secondary | ICD-10-CM | POA: Diagnosis not present

## 2013-11-26 DIAGNOSIS — L738 Other specified follicular disorders: Secondary | ICD-10-CM | POA: Diagnosis not present

## 2013-11-26 DIAGNOSIS — Z85828 Personal history of other malignant neoplasm of skin: Secondary | ICD-10-CM | POA: Diagnosis not present

## 2013-11-26 DIAGNOSIS — E11311 Type 2 diabetes mellitus with unspecified diabetic retinopathy with macular edema: Secondary | ICD-10-CM | POA: Diagnosis not present

## 2013-11-26 DIAGNOSIS — H4312 Vitreous hemorrhage, left eye: Secondary | ICD-10-CM | POA: Diagnosis not present

## 2013-11-26 DIAGNOSIS — D485 Neoplasm of uncertain behavior of skin: Secondary | ICD-10-CM | POA: Diagnosis not present

## 2013-11-26 DIAGNOSIS — L821 Other seborrheic keratosis: Secondary | ICD-10-CM | POA: Diagnosis not present

## 2013-11-26 DIAGNOSIS — L570 Actinic keratosis: Secondary | ICD-10-CM | POA: Diagnosis not present

## 2014-01-06 DIAGNOSIS — J209 Acute bronchitis, unspecified: Secondary | ICD-10-CM | POA: Diagnosis not present

## 2014-01-14 DIAGNOSIS — H1851 Endothelial corneal dystrophy: Secondary | ICD-10-CM | POA: Diagnosis not present

## 2014-01-14 DIAGNOSIS — E11311 Type 2 diabetes mellitus with unspecified diabetic retinopathy with macular edema: Secondary | ICD-10-CM | POA: Diagnosis not present

## 2014-01-14 DIAGNOSIS — E11351 Type 2 diabetes mellitus with proliferative diabetic retinopathy with macular edema: Secondary | ICD-10-CM | POA: Diagnosis not present

## 2014-01-14 DIAGNOSIS — E10359 Type 1 diabetes mellitus with proliferative diabetic retinopathy without macular edema: Secondary | ICD-10-CM | POA: Diagnosis not present

## 2014-01-14 DIAGNOSIS — H4312 Vitreous hemorrhage, left eye: Secondary | ICD-10-CM | POA: Diagnosis not present

## 2014-01-14 DIAGNOSIS — H209 Unspecified iridocyclitis: Secondary | ICD-10-CM | POA: Diagnosis not present

## 2014-01-14 DIAGNOSIS — Z961 Presence of intraocular lens: Secondary | ICD-10-CM | POA: Diagnosis not present

## 2014-01-14 DIAGNOSIS — H211X2 Other vascular disorders of iris and ciliary body, left eye: Secondary | ICD-10-CM | POA: Diagnosis not present

## 2014-02-07 DIAGNOSIS — S99921A Unspecified injury of right foot, initial encounter: Secondary | ICD-10-CM | POA: Diagnosis not present

## 2014-02-07 DIAGNOSIS — S92901A Unspecified fracture of right foot, initial encounter for closed fracture: Secondary | ICD-10-CM | POA: Diagnosis not present

## 2014-02-08 DIAGNOSIS — E109 Type 1 diabetes mellitus without complications: Secondary | ICD-10-CM | POA: Diagnosis not present

## 2014-02-08 DIAGNOSIS — Z79899 Other long term (current) drug therapy: Secondary | ICD-10-CM | POA: Diagnosis not present

## 2014-02-08 DIAGNOSIS — Z79890 Hormone replacement therapy (postmenopausal): Secondary | ICD-10-CM | POA: Diagnosis not present

## 2014-02-08 DIAGNOSIS — R079 Chest pain, unspecified: Secondary | ICD-10-CM | POA: Diagnosis not present

## 2014-02-08 DIAGNOSIS — R531 Weakness: Secondary | ICD-10-CM | POA: Diagnosis not present

## 2014-02-08 DIAGNOSIS — Z794 Long term (current) use of insulin: Secondary | ICD-10-CM | POA: Diagnosis not present

## 2014-02-08 DIAGNOSIS — I10 Essential (primary) hypertension: Secondary | ICD-10-CM | POA: Diagnosis not present

## 2014-02-08 DIAGNOSIS — S92901D Unspecified fracture of right foot, subsequent encounter for fracture with routine healing: Secondary | ICD-10-CM | POA: Diagnosis not present

## 2014-02-08 DIAGNOSIS — E785 Hyperlipidemia, unspecified: Secondary | ICD-10-CM | POA: Diagnosis not present

## 2014-02-08 DIAGNOSIS — R55 Syncope and collapse: Secondary | ICD-10-CM | POA: Diagnosis not present

## 2014-02-08 DIAGNOSIS — R404 Transient alteration of awareness: Secondary | ICD-10-CM | POA: Diagnosis not present

## 2014-02-10 DIAGNOSIS — S92354A Nondisplaced fracture of fifth metatarsal bone, right foot, initial encounter for closed fracture: Secondary | ICD-10-CM | POA: Diagnosis not present

## 2014-02-10 DIAGNOSIS — S92341A Displaced fracture of fourth metatarsal bone, right foot, initial encounter for closed fracture: Secondary | ICD-10-CM | POA: Diagnosis not present

## 2014-02-17 DIAGNOSIS — S92341D Displaced fracture of fourth metatarsal bone, right foot, subsequent encounter for fracture with routine healing: Secondary | ICD-10-CM | POA: Diagnosis not present

## 2014-02-17 DIAGNOSIS — S92354D Nondisplaced fracture of fifth metatarsal bone, right foot, subsequent encounter for fracture with routine healing: Secondary | ICD-10-CM | POA: Diagnosis not present

## 2014-03-06 DIAGNOSIS — E10319 Type 1 diabetes mellitus with unspecified diabetic retinopathy without macular edema: Secondary | ICD-10-CM | POA: Diagnosis not present

## 2014-03-06 DIAGNOSIS — Z6826 Body mass index (BMI) 26.0-26.9, adult: Secondary | ICD-10-CM | POA: Diagnosis not present

## 2014-03-06 DIAGNOSIS — I10 Essential (primary) hypertension: Secondary | ICD-10-CM | POA: Diagnosis not present

## 2014-03-06 DIAGNOSIS — E1039 Type 1 diabetes mellitus with other diabetic ophthalmic complication: Secondary | ICD-10-CM | POA: Diagnosis not present

## 2014-03-06 DIAGNOSIS — Z0000 Encounter for general adult medical examination without abnormal findings: Secondary | ICD-10-CM | POA: Diagnosis not present

## 2014-03-06 DIAGNOSIS — E785 Hyperlipidemia, unspecified: Secondary | ICD-10-CM | POA: Diagnosis not present

## 2014-03-06 DIAGNOSIS — K219 Gastro-esophageal reflux disease without esophagitis: Secondary | ICD-10-CM | POA: Diagnosis not present

## 2014-03-10 DIAGNOSIS — S92341D Displaced fracture of fourth metatarsal bone, right foot, subsequent encounter for fracture with routine healing: Secondary | ICD-10-CM | POA: Diagnosis not present

## 2014-04-22 DIAGNOSIS — E10351 Type 1 diabetes mellitus with proliferative diabetic retinopathy with macular edema: Secondary | ICD-10-CM | POA: Diagnosis not present

## 2014-04-22 DIAGNOSIS — E08311 Diabetes mellitus due to underlying condition with unspecified diabetic retinopathy with macular edema: Secondary | ICD-10-CM | POA: Insufficient documentation

## 2014-04-22 DIAGNOSIS — H4312 Vitreous hemorrhage, left eye: Secondary | ICD-10-CM | POA: Diagnosis not present

## 2014-04-22 DIAGNOSIS — E0836 Diabetes mellitus due to underlying condition with diabetic cataract: Secondary | ICD-10-CM | POA: Diagnosis not present

## 2014-04-22 DIAGNOSIS — Z961 Presence of intraocular lens: Secondary | ICD-10-CM | POA: Diagnosis not present

## 2014-04-22 DIAGNOSIS — H211X2 Other vascular disorders of iris and ciliary body, left eye: Secondary | ICD-10-CM | POA: Diagnosis not present

## 2014-04-29 DIAGNOSIS — H1851 Endothelial corneal dystrophy: Secondary | ICD-10-CM | POA: Diagnosis not present

## 2014-05-08 DIAGNOSIS — S92341D Displaced fracture of fourth metatarsal bone, right foot, subsequent encounter for fracture with routine healing: Secondary | ICD-10-CM | POA: Diagnosis not present

## 2014-06-30 DIAGNOSIS — H1851 Endothelial corneal dystrophy: Secondary | ICD-10-CM | POA: Diagnosis not present

## 2014-09-24 DIAGNOSIS — Z794 Long term (current) use of insulin: Secondary | ICD-10-CM | POA: Diagnosis not present

## 2014-09-24 DIAGNOSIS — Z6827 Body mass index (BMI) 27.0-27.9, adult: Secondary | ICD-10-CM | POA: Diagnosis not present

## 2014-09-24 DIAGNOSIS — E785 Hyperlipidemia, unspecified: Secondary | ICD-10-CM | POA: Diagnosis not present

## 2014-09-24 DIAGNOSIS — K219 Gastro-esophageal reflux disease without esophagitis: Secondary | ICD-10-CM | POA: Diagnosis not present

## 2014-09-24 DIAGNOSIS — E1039 Type 1 diabetes mellitus with other diabetic ophthalmic complication: Secondary | ICD-10-CM | POA: Diagnosis not present

## 2014-09-24 DIAGNOSIS — I10 Essential (primary) hypertension: Secondary | ICD-10-CM | POA: Diagnosis not present

## 2014-10-14 DIAGNOSIS — H4312 Vitreous hemorrhage, left eye: Secondary | ICD-10-CM | POA: Diagnosis not present

## 2014-10-14 DIAGNOSIS — E0836 Diabetes mellitus due to underlying condition with diabetic cataract: Secondary | ICD-10-CM | POA: Diagnosis not present

## 2014-10-14 DIAGNOSIS — E08311 Diabetes mellitus due to underlying condition with unspecified diabetic retinopathy with macular edema: Secondary | ICD-10-CM | POA: Diagnosis not present

## 2014-10-14 DIAGNOSIS — E103513 Type 1 diabetes mellitus with proliferative diabetic retinopathy with macular edema, bilateral: Secondary | ICD-10-CM | POA: Diagnosis not present

## 2014-10-14 DIAGNOSIS — H401130 Primary open-angle glaucoma, bilateral, stage unspecified: Secondary | ICD-10-CM | POA: Insufficient documentation

## 2014-10-14 DIAGNOSIS — E103519 Type 1 diabetes mellitus with proliferative diabetic retinopathy with macular edema, unspecified eye: Secondary | ICD-10-CM | POA: Diagnosis not present

## 2014-10-15 DIAGNOSIS — E1039 Type 1 diabetes mellitus with other diabetic ophthalmic complication: Secondary | ICD-10-CM | POA: Diagnosis not present

## 2014-10-15 DIAGNOSIS — Z6826 Body mass index (BMI) 26.0-26.9, adult: Secondary | ICD-10-CM | POA: Diagnosis not present

## 2014-10-15 DIAGNOSIS — I10 Essential (primary) hypertension: Secondary | ICD-10-CM | POA: Diagnosis not present

## 2014-10-28 DIAGNOSIS — H1851 Endothelial corneal dystrophy: Secondary | ICD-10-CM | POA: Diagnosis not present

## 2014-11-17 DIAGNOSIS — Z1231 Encounter for screening mammogram for malignant neoplasm of breast: Secondary | ICD-10-CM | POA: Diagnosis not present

## 2014-12-18 DIAGNOSIS — Z6827 Body mass index (BMI) 27.0-27.9, adult: Secondary | ICD-10-CM | POA: Diagnosis not present

## 2014-12-18 DIAGNOSIS — E1039 Type 1 diabetes mellitus with other diabetic ophthalmic complication: Secondary | ICD-10-CM | POA: Diagnosis not present

## 2014-12-18 DIAGNOSIS — I10 Essential (primary) hypertension: Secondary | ICD-10-CM | POA: Diagnosis not present

## 2015-01-26 DIAGNOSIS — H401130 Primary open-angle glaucoma, bilateral, stage unspecified: Secondary | ICD-10-CM | POA: Diagnosis not present

## 2015-01-26 DIAGNOSIS — H04123 Dry eye syndrome of bilateral lacrimal glands: Secondary | ICD-10-CM | POA: Diagnosis not present

## 2015-01-26 DIAGNOSIS — H1851 Endothelial corneal dystrophy: Secondary | ICD-10-CM | POA: Diagnosis not present

## 2015-01-26 DIAGNOSIS — H40053 Ocular hypertension, bilateral: Secondary | ICD-10-CM | POA: Diagnosis not present

## 2015-01-26 DIAGNOSIS — H1820 Unspecified corneal edema: Secondary | ICD-10-CM | POA: Diagnosis not present

## 2015-02-03 DIAGNOSIS — L821 Other seborrheic keratosis: Secondary | ICD-10-CM | POA: Diagnosis not present

## 2015-02-03 DIAGNOSIS — Z85828 Personal history of other malignant neoplasm of skin: Secondary | ICD-10-CM | POA: Diagnosis not present

## 2015-02-18 DIAGNOSIS — Z794 Long term (current) use of insulin: Secondary | ICD-10-CM | POA: Diagnosis not present

## 2015-02-18 DIAGNOSIS — E119 Type 2 diabetes mellitus without complications: Secondary | ICD-10-CM | POA: Diagnosis not present

## 2015-02-18 DIAGNOSIS — H1851 Endothelial corneal dystrophy: Secondary | ICD-10-CM | POA: Diagnosis not present

## 2015-02-18 DIAGNOSIS — I10 Essential (primary) hypertension: Secondary | ICD-10-CM | POA: Diagnosis not present

## 2015-02-18 DIAGNOSIS — K219 Gastro-esophageal reflux disease without esophagitis: Secondary | ICD-10-CM | POA: Diagnosis not present

## 2015-04-07 DIAGNOSIS — E103513 Type 1 diabetes mellitus with proliferative diabetic retinopathy with macular edema, bilateral: Secondary | ICD-10-CM | POA: Diagnosis not present

## 2015-04-07 DIAGNOSIS — E08311 Diabetes mellitus due to underlying condition with unspecified diabetic retinopathy with macular edema: Secondary | ICD-10-CM | POA: Diagnosis not present

## 2015-04-07 DIAGNOSIS — H4312 Vitreous hemorrhage, left eye: Secondary | ICD-10-CM | POA: Diagnosis not present

## 2015-04-07 DIAGNOSIS — E0836 Diabetes mellitus due to underlying condition with diabetic cataract: Secondary | ICD-10-CM | POA: Diagnosis not present

## 2015-04-07 DIAGNOSIS — E103519 Type 1 diabetes mellitus with proliferative diabetic retinopathy with macular edema, unspecified eye: Secondary | ICD-10-CM | POA: Diagnosis not present

## 2015-04-07 DIAGNOSIS — H1851 Endothelial corneal dystrophy: Secondary | ICD-10-CM | POA: Diagnosis not present

## 2015-04-19 DIAGNOSIS — T1501XA Foreign body in cornea, right eye, initial encounter: Secondary | ICD-10-CM | POA: Diagnosis not present

## 2015-04-20 DIAGNOSIS — T1501XA Foreign body in cornea, right eye, initial encounter: Secondary | ICD-10-CM | POA: Diagnosis not present

## 2015-04-20 DIAGNOSIS — E113513 Type 2 diabetes mellitus with proliferative diabetic retinopathy with macular edema, bilateral: Secondary | ICD-10-CM | POA: Diagnosis not present

## 2015-04-20 DIAGNOSIS — R112 Nausea with vomiting, unspecified: Secondary | ICD-10-CM | POA: Diagnosis not present

## 2015-04-20 DIAGNOSIS — H16041 Marginal corneal ulcer, right eye: Secondary | ICD-10-CM | POA: Diagnosis not present

## 2015-04-20 DIAGNOSIS — E113599 Type 2 diabetes mellitus with proliferative diabetic retinopathy without macular edema, unspecified eye: Secondary | ICD-10-CM | POA: Diagnosis not present

## 2015-04-20 DIAGNOSIS — Z794 Long term (current) use of insulin: Secondary | ICD-10-CM | POA: Diagnosis not present

## 2015-04-20 DIAGNOSIS — E871 Hypo-osmolality and hyponatremia: Secondary | ICD-10-CM | POA: Diagnosis not present

## 2015-04-20 DIAGNOSIS — E1165 Type 2 diabetes mellitus with hyperglycemia: Secondary | ICD-10-CM | POA: Diagnosis not present

## 2015-04-20 DIAGNOSIS — I10 Essential (primary) hypertension: Secondary | ICD-10-CM | POA: Diagnosis not present

## 2015-04-20 DIAGNOSIS — E878 Other disorders of electrolyte and fluid balance, not elsewhere classified: Secondary | ICD-10-CM | POA: Diagnosis not present

## 2015-04-20 DIAGNOSIS — H44001 Unspecified purulent endophthalmitis, right eye: Secondary | ICD-10-CM | POA: Diagnosis not present

## 2015-04-21 DIAGNOSIS — K219 Gastro-esophageal reflux disease without esophagitis: Secondary | ICD-10-CM | POA: Diagnosis present

## 2015-04-21 DIAGNOSIS — I10 Essential (primary) hypertension: Secondary | ICD-10-CM | POA: Diagnosis not present

## 2015-04-21 DIAGNOSIS — E878 Other disorders of electrolyte and fluid balance, not elsewhere classified: Secondary | ICD-10-CM | POA: Diagnosis present

## 2015-04-21 DIAGNOSIS — E871 Hypo-osmolality and hyponatremia: Secondary | ICD-10-CM | POA: Diagnosis not present

## 2015-04-21 DIAGNOSIS — H401130 Primary open-angle glaucoma, bilateral, stage unspecified: Secondary | ICD-10-CM | POA: Diagnosis present

## 2015-04-21 DIAGNOSIS — E785 Hyperlipidemia, unspecified: Secondary | ICD-10-CM | POA: Diagnosis present

## 2015-04-21 DIAGNOSIS — E876 Hypokalemia: Secondary | ICD-10-CM | POA: Diagnosis present

## 2015-04-21 DIAGNOSIS — H44001 Unspecified purulent endophthalmitis, right eye: Secondary | ICD-10-CM | POA: Diagnosis not present

## 2015-04-21 DIAGNOSIS — H16001 Unspecified corneal ulcer, right eye: Secondary | ICD-10-CM | POA: Diagnosis present

## 2015-04-21 DIAGNOSIS — E1165 Type 2 diabetes mellitus with hyperglycemia: Secondary | ICD-10-CM | POA: Diagnosis present

## 2015-04-21 DIAGNOSIS — I517 Cardiomegaly: Secondary | ICD-10-CM | POA: Diagnosis not present

## 2015-04-21 DIAGNOSIS — Z794 Long term (current) use of insulin: Secondary | ICD-10-CM | POA: Diagnosis not present

## 2015-04-21 DIAGNOSIS — E113599 Type 2 diabetes mellitus with proliferative diabetic retinopathy without macular edema, unspecified eye: Secondary | ICD-10-CM | POA: Diagnosis present

## 2015-04-21 DIAGNOSIS — H547 Unspecified visual loss: Secondary | ICD-10-CM | POA: Diagnosis present

## 2015-04-21 DIAGNOSIS — B953 Streptococcus pneumoniae as the cause of diseases classified elsewhere: Secondary | ICD-10-CM | POA: Diagnosis present

## 2015-04-21 DIAGNOSIS — E113513 Type 2 diabetes mellitus with proliferative diabetic retinopathy with macular edema, bilateral: Secondary | ICD-10-CM | POA: Diagnosis not present

## 2015-04-21 DIAGNOSIS — E861 Hypovolemia: Secondary | ICD-10-CM | POA: Diagnosis present

## 2015-04-30 DIAGNOSIS — E103513 Type 1 diabetes mellitus with proliferative diabetic retinopathy with macular edema, bilateral: Secondary | ICD-10-CM | POA: Diagnosis not present

## 2015-04-30 DIAGNOSIS — E113591 Type 2 diabetes mellitus with proliferative diabetic retinopathy without macular edema, right eye: Secondary | ICD-10-CM | POA: Diagnosis not present

## 2015-04-30 DIAGNOSIS — Z5181 Encounter for therapeutic drug level monitoring: Secondary | ICD-10-CM | POA: Diagnosis not present

## 2015-04-30 DIAGNOSIS — Z8669 Personal history of other diseases of the nervous system and sense organs: Secondary | ICD-10-CM | POA: Diagnosis not present

## 2015-04-30 DIAGNOSIS — H1851 Endothelial corneal dystrophy: Secondary | ICD-10-CM | POA: Diagnosis not present

## 2015-04-30 DIAGNOSIS — Z79899 Other long term (current) drug therapy: Secondary | ICD-10-CM | POA: Diagnosis not present

## 2015-04-30 DIAGNOSIS — I10 Essential (primary) hypertension: Secondary | ICD-10-CM | POA: Diagnosis not present

## 2015-04-30 DIAGNOSIS — H44001 Unspecified purulent endophthalmitis, right eye: Secondary | ICD-10-CM | POA: Diagnosis not present

## 2015-04-30 DIAGNOSIS — Z9889 Other specified postprocedural states: Secondary | ICD-10-CM | POA: Diagnosis not present

## 2015-04-30 DIAGNOSIS — T1501XA Foreign body in cornea, right eye, initial encounter: Secondary | ICD-10-CM | POA: Diagnosis not present

## 2015-04-30 DIAGNOSIS — K219 Gastro-esophageal reflux disease without esophagitis: Secondary | ICD-10-CM | POA: Diagnosis not present

## 2015-04-30 DIAGNOSIS — Z794 Long term (current) use of insulin: Secondary | ICD-10-CM | POA: Diagnosis not present

## 2015-05-01 DIAGNOSIS — T1501XA Foreign body in cornea, right eye, initial encounter: Secondary | ICD-10-CM | POA: Diagnosis not present

## 2015-05-01 DIAGNOSIS — Z947 Corneal transplant status: Secondary | ICD-10-CM | POA: Diagnosis not present

## 2015-05-01 DIAGNOSIS — E103513 Type 1 diabetes mellitus with proliferative diabetic retinopathy with macular edema, bilateral: Secondary | ICD-10-CM | POA: Diagnosis not present

## 2015-05-01 DIAGNOSIS — H44001 Unspecified purulent endophthalmitis, right eye: Secondary | ICD-10-CM | POA: Diagnosis not present

## 2015-05-03 DIAGNOSIS — E103513 Type 1 diabetes mellitus with proliferative diabetic retinopathy with macular edema, bilateral: Secondary | ICD-10-CM | POA: Diagnosis not present

## 2015-05-03 DIAGNOSIS — H44001 Unspecified purulent endophthalmitis, right eye: Secondary | ICD-10-CM | POA: Diagnosis not present

## 2015-05-03 DIAGNOSIS — T1501XA Foreign body in cornea, right eye, initial encounter: Secondary | ICD-10-CM | POA: Diagnosis not present

## 2015-05-07 DIAGNOSIS — E1039 Type 1 diabetes mellitus with other diabetic ophthalmic complication: Secondary | ICD-10-CM | POA: Diagnosis not present

## 2015-05-07 DIAGNOSIS — Z6825 Body mass index (BMI) 25.0-25.9, adult: Secondary | ICD-10-CM | POA: Diagnosis not present

## 2015-05-07 DIAGNOSIS — K529 Noninfective gastroenteritis and colitis, unspecified: Secondary | ICD-10-CM | POA: Diagnosis not present

## 2015-05-07 DIAGNOSIS — R05 Cough: Secondary | ICD-10-CM | POA: Diagnosis not present

## 2015-05-07 DIAGNOSIS — I10 Essential (primary) hypertension: Secondary | ICD-10-CM | POA: Diagnosis not present

## 2015-05-07 DIAGNOSIS — F418 Other specified anxiety disorders: Secondary | ICD-10-CM | POA: Diagnosis not present

## 2015-06-23 DIAGNOSIS — E103513 Type 1 diabetes mellitus with proliferative diabetic retinopathy with macular edema, bilateral: Secondary | ICD-10-CM | POA: Insufficient documentation

## 2015-06-23 DIAGNOSIS — H44001 Unspecified purulent endophthalmitis, right eye: Secondary | ICD-10-CM | POA: Diagnosis not present

## 2015-06-23 DIAGNOSIS — Z947 Corneal transplant status: Secondary | ICD-10-CM | POA: Insufficient documentation

## 2015-07-28 DIAGNOSIS — Z947 Corneal transplant status: Secondary | ICD-10-CM | POA: Diagnosis not present

## 2015-07-28 DIAGNOSIS — T1501XA Foreign body in cornea, right eye, initial encounter: Secondary | ICD-10-CM | POA: Diagnosis not present

## 2015-07-28 DIAGNOSIS — H44001 Unspecified purulent endophthalmitis, right eye: Secondary | ICD-10-CM | POA: Diagnosis not present

## 2015-07-28 DIAGNOSIS — E103593 Type 1 diabetes mellitus with proliferative diabetic retinopathy without macular edema, bilateral: Secondary | ICD-10-CM | POA: Diagnosis not present

## 2015-07-28 DIAGNOSIS — E0836 Diabetes mellitus due to underlying condition with diabetic cataract: Secondary | ICD-10-CM | POA: Diagnosis not present

## 2015-08-31 DIAGNOSIS — M62830 Muscle spasm of back: Secondary | ICD-10-CM | POA: Diagnosis not present

## 2015-08-31 DIAGNOSIS — M9903 Segmental and somatic dysfunction of lumbar region: Secondary | ICD-10-CM | POA: Diagnosis not present

## 2015-08-31 DIAGNOSIS — M9901 Segmental and somatic dysfunction of cervical region: Secondary | ICD-10-CM | POA: Diagnosis not present

## 2015-09-02 DIAGNOSIS — M9901 Segmental and somatic dysfunction of cervical region: Secondary | ICD-10-CM | POA: Diagnosis not present

## 2015-09-02 DIAGNOSIS — M62830 Muscle spasm of back: Secondary | ICD-10-CM | POA: Diagnosis not present

## 2015-09-02 DIAGNOSIS — M9903 Segmental and somatic dysfunction of lumbar region: Secondary | ICD-10-CM | POA: Diagnosis not present

## 2015-09-06 DIAGNOSIS — M9901 Segmental and somatic dysfunction of cervical region: Secondary | ICD-10-CM | POA: Diagnosis not present

## 2015-09-06 DIAGNOSIS — M9903 Segmental and somatic dysfunction of lumbar region: Secondary | ICD-10-CM | POA: Diagnosis not present

## 2015-09-06 DIAGNOSIS — M62830 Muscle spasm of back: Secondary | ICD-10-CM | POA: Diagnosis not present

## 2015-09-07 DIAGNOSIS — M62830 Muscle spasm of back: Secondary | ICD-10-CM | POA: Diagnosis not present

## 2015-09-07 DIAGNOSIS — M9903 Segmental and somatic dysfunction of lumbar region: Secondary | ICD-10-CM | POA: Diagnosis not present

## 2015-09-07 DIAGNOSIS — M9901 Segmental and somatic dysfunction of cervical region: Secondary | ICD-10-CM | POA: Diagnosis not present

## 2015-09-09 DIAGNOSIS — M9903 Segmental and somatic dysfunction of lumbar region: Secondary | ICD-10-CM | POA: Diagnosis not present

## 2015-09-09 DIAGNOSIS — M62830 Muscle spasm of back: Secondary | ICD-10-CM | POA: Diagnosis not present

## 2015-09-09 DIAGNOSIS — M9901 Segmental and somatic dysfunction of cervical region: Secondary | ICD-10-CM | POA: Diagnosis not present

## 2015-09-15 DIAGNOSIS — M9901 Segmental and somatic dysfunction of cervical region: Secondary | ICD-10-CM | POA: Diagnosis not present

## 2015-09-15 DIAGNOSIS — M62830 Muscle spasm of back: Secondary | ICD-10-CM | POA: Diagnosis not present

## 2015-09-15 DIAGNOSIS — M9903 Segmental and somatic dysfunction of lumbar region: Secondary | ICD-10-CM | POA: Diagnosis not present

## 2015-09-22 DIAGNOSIS — M62830 Muscle spasm of back: Secondary | ICD-10-CM | POA: Diagnosis not present

## 2015-09-22 DIAGNOSIS — M9903 Segmental and somatic dysfunction of lumbar region: Secondary | ICD-10-CM | POA: Diagnosis not present

## 2015-09-22 DIAGNOSIS — M9901 Segmental and somatic dysfunction of cervical region: Secondary | ICD-10-CM | POA: Diagnosis not present

## 2015-09-28 DIAGNOSIS — M9901 Segmental and somatic dysfunction of cervical region: Secondary | ICD-10-CM | POA: Diagnosis not present

## 2015-09-28 DIAGNOSIS — M9903 Segmental and somatic dysfunction of lumbar region: Secondary | ICD-10-CM | POA: Diagnosis not present

## 2015-09-28 DIAGNOSIS — M62830 Muscle spasm of back: Secondary | ICD-10-CM | POA: Diagnosis not present

## 2015-09-30 DIAGNOSIS — M62830 Muscle spasm of back: Secondary | ICD-10-CM | POA: Diagnosis not present

## 2015-09-30 DIAGNOSIS — M9901 Segmental and somatic dysfunction of cervical region: Secondary | ICD-10-CM | POA: Diagnosis not present

## 2015-09-30 DIAGNOSIS — M9903 Segmental and somatic dysfunction of lumbar region: Secondary | ICD-10-CM | POA: Diagnosis not present

## 2015-10-05 DIAGNOSIS — M9903 Segmental and somatic dysfunction of lumbar region: Secondary | ICD-10-CM | POA: Diagnosis not present

## 2015-10-05 DIAGNOSIS — M9901 Segmental and somatic dysfunction of cervical region: Secondary | ICD-10-CM | POA: Diagnosis not present

## 2015-10-05 DIAGNOSIS — M62830 Muscle spasm of back: Secondary | ICD-10-CM | POA: Diagnosis not present

## 2015-10-06 DIAGNOSIS — E103593 Type 1 diabetes mellitus with proliferative diabetic retinopathy without macular edema, bilateral: Secondary | ICD-10-CM | POA: Diagnosis not present

## 2015-10-06 DIAGNOSIS — Z947 Corneal transplant status: Secondary | ICD-10-CM | POA: Diagnosis not present

## 2015-10-06 DIAGNOSIS — E0836 Diabetes mellitus due to underlying condition with diabetic cataract: Secondary | ICD-10-CM | POA: Diagnosis not present

## 2015-10-06 DIAGNOSIS — H1851 Endothelial corneal dystrophy: Secondary | ICD-10-CM | POA: Diagnosis not present

## 2015-10-06 DIAGNOSIS — H44001 Unspecified purulent endophthalmitis, right eye: Secondary | ICD-10-CM | POA: Diagnosis not present

## 2015-10-06 DIAGNOSIS — E08311 Diabetes mellitus due to underlying condition with unspecified diabetic retinopathy with macular edema: Secondary | ICD-10-CM | POA: Diagnosis not present

## 2015-10-06 DIAGNOSIS — H16041 Marginal corneal ulcer, right eye: Secondary | ICD-10-CM | POA: Diagnosis not present

## 2015-10-11 DIAGNOSIS — M9903 Segmental and somatic dysfunction of lumbar region: Secondary | ICD-10-CM | POA: Diagnosis not present

## 2015-10-11 DIAGNOSIS — M9901 Segmental and somatic dysfunction of cervical region: Secondary | ICD-10-CM | POA: Diagnosis not present

## 2015-10-11 DIAGNOSIS — M62830 Muscle spasm of back: Secondary | ICD-10-CM | POA: Diagnosis not present

## 2015-10-15 DIAGNOSIS — H40053 Ocular hypertension, bilateral: Secondary | ICD-10-CM | POA: Diagnosis not present

## 2015-11-04 DIAGNOSIS — M9903 Segmental and somatic dysfunction of lumbar region: Secondary | ICD-10-CM | POA: Diagnosis not present

## 2015-11-04 DIAGNOSIS — M62830 Muscle spasm of back: Secondary | ICD-10-CM | POA: Diagnosis not present

## 2015-11-04 DIAGNOSIS — M9901 Segmental and somatic dysfunction of cervical region: Secondary | ICD-10-CM | POA: Diagnosis not present

## 2015-11-18 DIAGNOSIS — B351 Tinea unguium: Secondary | ICD-10-CM | POA: Diagnosis not present

## 2015-11-18 DIAGNOSIS — I10 Essential (primary) hypertension: Secondary | ICD-10-CM | POA: Diagnosis not present

## 2015-11-18 DIAGNOSIS — H54415A Blindness right eye category 5, normal vision left eye: Secondary | ICD-10-CM | POA: Diagnosis not present

## 2015-11-18 DIAGNOSIS — Z794 Long term (current) use of insulin: Secondary | ICD-10-CM | POA: Diagnosis not present

## 2015-11-18 DIAGNOSIS — Z419 Encounter for procedure for purposes other than remedying health state, unspecified: Secondary | ICD-10-CM | POA: Diagnosis not present

## 2015-11-18 DIAGNOSIS — D1801 Hemangioma of skin and subcutaneous tissue: Secondary | ICD-10-CM | POA: Diagnosis not present

## 2015-11-18 DIAGNOSIS — E1039 Type 1 diabetes mellitus with other diabetic ophthalmic complication: Secondary | ICD-10-CM | POA: Diagnosis not present

## 2015-11-18 DIAGNOSIS — I788 Other diseases of capillaries: Secondary | ICD-10-CM | POA: Diagnosis not present

## 2015-11-18 DIAGNOSIS — L821 Other seborrheic keratosis: Secondary | ICD-10-CM | POA: Diagnosis not present

## 2015-11-18 DIAGNOSIS — L918 Other hypertrophic disorders of the skin: Secondary | ICD-10-CM | POA: Diagnosis not present

## 2015-11-18 DIAGNOSIS — Z6825 Body mass index (BMI) 25.0-25.9, adult: Secondary | ICD-10-CM | POA: Diagnosis not present

## 2015-11-18 DIAGNOSIS — Z85828 Personal history of other malignant neoplasm of skin: Secondary | ICD-10-CM | POA: Diagnosis not present

## 2015-11-18 DIAGNOSIS — D692 Other nonthrombocytopenic purpura: Secondary | ICD-10-CM | POA: Diagnosis not present

## 2015-11-18 DIAGNOSIS — E11319 Type 2 diabetes mellitus with unspecified diabetic retinopathy without macular edema: Secondary | ICD-10-CM | POA: Diagnosis not present

## 2016-01-12 DIAGNOSIS — M9903 Segmental and somatic dysfunction of lumbar region: Secondary | ICD-10-CM | POA: Diagnosis not present

## 2016-01-12 DIAGNOSIS — M9901 Segmental and somatic dysfunction of cervical region: Secondary | ICD-10-CM | POA: Diagnosis not present

## 2016-01-12 DIAGNOSIS — M62830 Muscle spasm of back: Secondary | ICD-10-CM | POA: Diagnosis not present

## 2016-01-13 DIAGNOSIS — M62830 Muscle spasm of back: Secondary | ICD-10-CM | POA: Diagnosis not present

## 2016-01-13 DIAGNOSIS — M9903 Segmental and somatic dysfunction of lumbar region: Secondary | ICD-10-CM | POA: Diagnosis not present

## 2016-01-13 DIAGNOSIS — M9901 Segmental and somatic dysfunction of cervical region: Secondary | ICD-10-CM | POA: Diagnosis not present

## 2016-01-18 DIAGNOSIS — M9901 Segmental and somatic dysfunction of cervical region: Secondary | ICD-10-CM | POA: Diagnosis not present

## 2016-01-18 DIAGNOSIS — M62830 Muscle spasm of back: Secondary | ICD-10-CM | POA: Diagnosis not present

## 2016-01-18 DIAGNOSIS — M9903 Segmental and somatic dysfunction of lumbar region: Secondary | ICD-10-CM | POA: Diagnosis not present

## 2016-01-25 DIAGNOSIS — M62830 Muscle spasm of back: Secondary | ICD-10-CM | POA: Diagnosis not present

## 2016-01-25 DIAGNOSIS — M9903 Segmental and somatic dysfunction of lumbar region: Secondary | ICD-10-CM | POA: Diagnosis not present

## 2016-01-25 DIAGNOSIS — M9901 Segmental and somatic dysfunction of cervical region: Secondary | ICD-10-CM | POA: Diagnosis not present

## 2016-02-01 DIAGNOSIS — M62830 Muscle spasm of back: Secondary | ICD-10-CM | POA: Diagnosis not present

## 2016-02-01 DIAGNOSIS — M9903 Segmental and somatic dysfunction of lumbar region: Secondary | ICD-10-CM | POA: Diagnosis not present

## 2016-02-01 DIAGNOSIS — M9901 Segmental and somatic dysfunction of cervical region: Secondary | ICD-10-CM | POA: Diagnosis not present

## 2016-02-09 DIAGNOSIS — Z947 Corneal transplant status: Secondary | ICD-10-CM | POA: Diagnosis not present

## 2016-02-09 DIAGNOSIS — H01021 Squamous blepharitis right upper eyelid: Secondary | ICD-10-CM | POA: Diagnosis not present

## 2016-02-16 DIAGNOSIS — M9903 Segmental and somatic dysfunction of lumbar region: Secondary | ICD-10-CM | POA: Diagnosis not present

## 2016-02-16 DIAGNOSIS — M62830 Muscle spasm of back: Secondary | ICD-10-CM | POA: Diagnosis not present

## 2016-02-16 DIAGNOSIS — M9901 Segmental and somatic dysfunction of cervical region: Secondary | ICD-10-CM | POA: Diagnosis not present

## 2016-02-21 DIAGNOSIS — E08311 Diabetes mellitus due to underlying condition with unspecified diabetic retinopathy with macular edema: Secondary | ICD-10-CM | POA: Diagnosis not present

## 2016-02-21 DIAGNOSIS — E103593 Type 1 diabetes mellitus with proliferative diabetic retinopathy without macular edema, bilateral: Secondary | ICD-10-CM | POA: Diagnosis not present

## 2016-02-21 DIAGNOSIS — E0836 Diabetes mellitus due to underlying condition with diabetic cataract: Secondary | ICD-10-CM | POA: Diagnosis not present

## 2016-02-21 DIAGNOSIS — H44001 Unspecified purulent endophthalmitis, right eye: Secondary | ICD-10-CM | POA: Diagnosis not present

## 2016-02-21 DIAGNOSIS — H4312 Vitreous hemorrhage, left eye: Secondary | ICD-10-CM | POA: Diagnosis not present

## 2016-02-21 DIAGNOSIS — H1851 Endothelial corneal dystrophy: Secondary | ICD-10-CM | POA: Diagnosis not present

## 2016-02-21 DIAGNOSIS — Z947 Corneal transplant status: Secondary | ICD-10-CM | POA: Diagnosis not present

## 2016-02-21 DIAGNOSIS — H16041 Marginal corneal ulcer, right eye: Secondary | ICD-10-CM | POA: Diagnosis not present

## 2016-02-21 DIAGNOSIS — E103513 Type 1 diabetes mellitus with proliferative diabetic retinopathy with macular edema, bilateral: Secondary | ICD-10-CM | POA: Diagnosis not present

## 2016-02-21 DIAGNOSIS — H401130 Primary open-angle glaucoma, bilateral, stage unspecified: Secondary | ICD-10-CM | POA: Diagnosis not present

## 2016-02-29 DIAGNOSIS — Z1231 Encounter for screening mammogram for malignant neoplasm of breast: Secondary | ICD-10-CM | POA: Diagnosis not present

## 2016-02-29 DIAGNOSIS — Z803 Family history of malignant neoplasm of breast: Secondary | ICD-10-CM | POA: Diagnosis not present

## 2016-03-07 DIAGNOSIS — M62830 Muscle spasm of back: Secondary | ICD-10-CM | POA: Diagnosis not present

## 2016-03-07 DIAGNOSIS — M9903 Segmental and somatic dysfunction of lumbar region: Secondary | ICD-10-CM | POA: Diagnosis not present

## 2016-03-07 DIAGNOSIS — M9901 Segmental and somatic dysfunction of cervical region: Secondary | ICD-10-CM | POA: Diagnosis not present

## 2016-03-21 DIAGNOSIS — M9901 Segmental and somatic dysfunction of cervical region: Secondary | ICD-10-CM | POA: Diagnosis not present

## 2016-03-21 DIAGNOSIS — M9903 Segmental and somatic dysfunction of lumbar region: Secondary | ICD-10-CM | POA: Diagnosis not present

## 2016-03-21 DIAGNOSIS — M62830 Muscle spasm of back: Secondary | ICD-10-CM | POA: Diagnosis not present

## 2016-03-29 DIAGNOSIS — M25551 Pain in right hip: Secondary | ICD-10-CM | POA: Diagnosis not present

## 2016-03-29 DIAGNOSIS — M5441 Lumbago with sciatica, right side: Secondary | ICD-10-CM | POA: Diagnosis not present

## 2016-04-04 DIAGNOSIS — H40053 Ocular hypertension, bilateral: Secondary | ICD-10-CM | POA: Diagnosis not present

## 2016-04-14 DIAGNOSIS — E784 Other hyperlipidemia: Secondary | ICD-10-CM | POA: Diagnosis not present

## 2016-04-14 DIAGNOSIS — I10 Essential (primary) hypertension: Secondary | ICD-10-CM | POA: Diagnosis not present

## 2016-04-14 DIAGNOSIS — E1039 Type 1 diabetes mellitus with other diabetic ophthalmic complication: Secondary | ICD-10-CM | POA: Diagnosis not present

## 2016-04-21 DIAGNOSIS — M5136 Other intervertebral disc degeneration, lumbar region: Secondary | ICD-10-CM | POA: Diagnosis not present

## 2016-04-21 DIAGNOSIS — M5441 Lumbago with sciatica, right side: Secondary | ICD-10-CM | POA: Diagnosis not present

## 2016-05-03 DIAGNOSIS — M5441 Lumbago with sciatica, right side: Secondary | ICD-10-CM | POA: Diagnosis not present

## 2016-05-03 DIAGNOSIS — G8929 Other chronic pain: Secondary | ICD-10-CM | POA: Diagnosis not present

## 2016-05-09 DIAGNOSIS — M62830 Muscle spasm of back: Secondary | ICD-10-CM | POA: Diagnosis not present

## 2016-05-09 DIAGNOSIS — M9901 Segmental and somatic dysfunction of cervical region: Secondary | ICD-10-CM | POA: Diagnosis not present

## 2016-05-09 DIAGNOSIS — M9903 Segmental and somatic dysfunction of lumbar region: Secondary | ICD-10-CM | POA: Diagnosis not present

## 2016-05-19 DIAGNOSIS — G8929 Other chronic pain: Secondary | ICD-10-CM | POA: Diagnosis not present

## 2016-05-19 DIAGNOSIS — M5441 Lumbago with sciatica, right side: Secondary | ICD-10-CM | POA: Diagnosis not present

## 2016-05-26 DIAGNOSIS — M5136 Other intervertebral disc degeneration, lumbar region: Secondary | ICD-10-CM | POA: Diagnosis not present

## 2016-05-26 DIAGNOSIS — M5441 Lumbago with sciatica, right side: Secondary | ICD-10-CM | POA: Diagnosis not present

## 2016-05-26 DIAGNOSIS — G8929 Other chronic pain: Secondary | ICD-10-CM | POA: Diagnosis not present

## 2016-06-09 DIAGNOSIS — M545 Low back pain: Secondary | ICD-10-CM | POA: Diagnosis not present

## 2016-06-09 DIAGNOSIS — M5136 Other intervertebral disc degeneration, lumbar region: Secondary | ICD-10-CM | POA: Diagnosis not present

## 2016-06-09 DIAGNOSIS — M5117 Intervertebral disc disorders with radiculopathy, lumbosacral region: Secondary | ICD-10-CM | POA: Diagnosis not present

## 2016-06-20 DIAGNOSIS — M5136 Other intervertebral disc degeneration, lumbar region: Secondary | ICD-10-CM | POA: Diagnosis not present

## 2016-06-20 DIAGNOSIS — M62830 Muscle spasm of back: Secondary | ICD-10-CM | POA: Diagnosis not present

## 2016-06-20 DIAGNOSIS — M9903 Segmental and somatic dysfunction of lumbar region: Secondary | ICD-10-CM | POA: Diagnosis not present

## 2016-06-20 DIAGNOSIS — M5441 Lumbago with sciatica, right side: Secondary | ICD-10-CM | POA: Diagnosis not present

## 2016-06-20 DIAGNOSIS — M9901 Segmental and somatic dysfunction of cervical region: Secondary | ICD-10-CM | POA: Diagnosis not present

## 2016-06-20 DIAGNOSIS — G8929 Other chronic pain: Secondary | ICD-10-CM | POA: Diagnosis not present

## 2016-06-30 DIAGNOSIS — M5136 Other intervertebral disc degeneration, lumbar region: Secondary | ICD-10-CM | POA: Diagnosis not present

## 2016-06-30 DIAGNOSIS — M545 Low back pain: Secondary | ICD-10-CM | POA: Diagnosis not present

## 2016-06-30 DIAGNOSIS — M5117 Intervertebral disc disorders with radiculopathy, lumbosacral region: Secondary | ICD-10-CM | POA: Diagnosis not present

## 2016-07-18 DIAGNOSIS — M5417 Radiculopathy, lumbosacral region: Secondary | ICD-10-CM | POA: Diagnosis not present

## 2016-07-18 DIAGNOSIS — M47816 Spondylosis without myelopathy or radiculopathy, lumbar region: Secondary | ICD-10-CM | POA: Diagnosis not present

## 2016-07-18 DIAGNOSIS — Z01810 Encounter for preprocedural cardiovascular examination: Secondary | ICD-10-CM | POA: Diagnosis not present

## 2016-07-18 DIAGNOSIS — R202 Paresthesia of skin: Secondary | ICD-10-CM | POA: Diagnosis not present

## 2016-07-18 DIAGNOSIS — M25752 Osteophyte, left hip: Secondary | ICD-10-CM | POA: Diagnosis not present

## 2016-07-18 DIAGNOSIS — M25751 Osteophyte, right hip: Secondary | ICD-10-CM | POA: Diagnosis not present

## 2016-07-18 DIAGNOSIS — M1610 Unilateral primary osteoarthritis, unspecified hip: Secondary | ICD-10-CM | POA: Diagnosis not present

## 2016-07-18 DIAGNOSIS — Z01818 Encounter for other preprocedural examination: Secondary | ICD-10-CM | POA: Diagnosis not present

## 2016-07-18 DIAGNOSIS — Z01812 Encounter for preprocedural laboratory examination: Secondary | ICD-10-CM | POA: Diagnosis not present

## 2016-07-18 DIAGNOSIS — E109 Type 1 diabetes mellitus without complications: Secondary | ICD-10-CM | POA: Diagnosis not present

## 2016-07-18 DIAGNOSIS — M5136 Other intervertebral disc degeneration, lumbar region: Secondary | ICD-10-CM | POA: Diagnosis not present

## 2016-07-18 DIAGNOSIS — I10 Essential (primary) hypertension: Secondary | ICD-10-CM | POA: Diagnosis not present

## 2016-07-19 DIAGNOSIS — M5116 Intervertebral disc disorders with radiculopathy, lumbar region: Secondary | ICD-10-CM | POA: Diagnosis not present

## 2016-07-19 DIAGNOSIS — M48061 Spinal stenosis, lumbar region without neurogenic claudication: Secondary | ICD-10-CM | POA: Diagnosis not present

## 2016-07-19 DIAGNOSIS — D72829 Elevated white blood cell count, unspecified: Secondary | ICD-10-CM | POA: Diagnosis not present

## 2016-07-19 DIAGNOSIS — Z01818 Encounter for other preprocedural examination: Secondary | ICD-10-CM | POA: Diagnosis not present

## 2016-07-21 DIAGNOSIS — M5126 Other intervertebral disc displacement, lumbar region: Secondary | ICD-10-CM | POA: Diagnosis not present

## 2016-07-21 DIAGNOSIS — M48061 Spinal stenosis, lumbar region without neurogenic claudication: Secondary | ICD-10-CM | POA: Diagnosis not present

## 2016-07-21 DIAGNOSIS — M4726 Other spondylosis with radiculopathy, lumbar region: Secondary | ICD-10-CM | POA: Diagnosis not present

## 2016-07-22 DIAGNOSIS — Z4889 Encounter for other specified surgical aftercare: Secondary | ICD-10-CM | POA: Diagnosis not present

## 2016-08-04 DIAGNOSIS — Z5189 Encounter for other specified aftercare: Secondary | ICD-10-CM | POA: Diagnosis not present

## 2016-08-04 DIAGNOSIS — Z6825 Body mass index (BMI) 25.0-25.9, adult: Secondary | ICD-10-CM | POA: Diagnosis not present

## 2016-08-04 DIAGNOSIS — M5416 Radiculopathy, lumbar region: Secondary | ICD-10-CM | POA: Diagnosis not present

## 2016-08-23 DIAGNOSIS — Z947 Corneal transplant status: Secondary | ICD-10-CM | POA: Diagnosis not present

## 2016-08-23 DIAGNOSIS — H1851 Endothelial corneal dystrophy: Secondary | ICD-10-CM | POA: Diagnosis not present

## 2016-08-23 DIAGNOSIS — E0836 Diabetes mellitus due to underlying condition with diabetic cataract: Secondary | ICD-10-CM | POA: Diagnosis not present

## 2016-08-23 DIAGNOSIS — H16041 Marginal corneal ulcer, right eye: Secondary | ICD-10-CM | POA: Diagnosis not present

## 2016-08-23 DIAGNOSIS — H4312 Vitreous hemorrhage, left eye: Secondary | ICD-10-CM | POA: Diagnosis not present

## 2016-08-23 DIAGNOSIS — H44001 Unspecified purulent endophthalmitis, right eye: Secondary | ICD-10-CM | POA: Diagnosis not present

## 2016-08-23 DIAGNOSIS — H1820 Unspecified corneal edema: Secondary | ICD-10-CM | POA: Diagnosis not present

## 2016-08-23 DIAGNOSIS — E103593 Type 1 diabetes mellitus with proliferative diabetic retinopathy without macular edema, bilateral: Secondary | ICD-10-CM | POA: Diagnosis not present

## 2016-08-23 DIAGNOSIS — E08311 Diabetes mellitus due to underlying condition with unspecified diabetic retinopathy with macular edema: Secondary | ICD-10-CM | POA: Diagnosis not present

## 2016-08-23 DIAGNOSIS — H01021 Squamous blepharitis right upper eyelid: Secondary | ICD-10-CM | POA: Diagnosis not present

## 2016-08-24 DIAGNOSIS — F418 Other specified anxiety disorders: Secondary | ICD-10-CM | POA: Diagnosis not present

## 2016-08-24 DIAGNOSIS — E10319 Type 1 diabetes mellitus with unspecified diabetic retinopathy without macular edema: Secondary | ICD-10-CM | POA: Diagnosis not present

## 2016-08-24 DIAGNOSIS — H54415A Blindness right eye category 5, normal vision left eye: Secondary | ICD-10-CM | POA: Diagnosis not present

## 2016-08-24 DIAGNOSIS — E784 Other hyperlipidemia: Secondary | ICD-10-CM | POA: Diagnosis not present

## 2016-08-24 DIAGNOSIS — E1039 Type 1 diabetes mellitus with other diabetic ophthalmic complication: Secondary | ICD-10-CM | POA: Diagnosis not present

## 2016-08-24 DIAGNOSIS — K529 Noninfective gastroenteritis and colitis, unspecified: Secondary | ICD-10-CM | POA: Diagnosis not present

## 2016-08-24 DIAGNOSIS — I10 Essential (primary) hypertension: Secondary | ICD-10-CM | POA: Diagnosis not present

## 2016-08-24 DIAGNOSIS — Z6825 Body mass index (BMI) 25.0-25.9, adult: Secondary | ICD-10-CM | POA: Diagnosis not present

## 2016-08-24 DIAGNOSIS — K219 Gastro-esophageal reflux disease without esophagitis: Secondary | ICD-10-CM | POA: Diagnosis not present

## 2016-08-24 DIAGNOSIS — Z794 Long term (current) use of insulin: Secondary | ICD-10-CM | POA: Diagnosis not present

## 2016-08-24 DIAGNOSIS — M5416 Radiculopathy, lumbar region: Secondary | ICD-10-CM | POA: Diagnosis not present

## 2016-10-02 DIAGNOSIS — M545 Low back pain: Secondary | ICD-10-CM | POA: Diagnosis not present

## 2016-10-03 DIAGNOSIS — H40052 Ocular hypertension, left eye: Secondary | ICD-10-CM | POA: Diagnosis not present

## 2016-10-07 ENCOUNTER — Encounter (HOSPITAL_COMMUNITY): Payer: Self-pay | Admitting: *Deleted

## 2016-10-07 ENCOUNTER — Emergency Department (HOSPITAL_COMMUNITY)
Admission: EM | Admit: 2016-10-07 | Discharge: 2016-10-07 | Disposition: A | Discharge status: Home / Self Care | Payer: Medicare Other | Attending: Emergency Medicine | Admitting: Emergency Medicine

## 2016-10-07 DIAGNOSIS — Z794 Long term (current) use of insulin: Secondary | ICD-10-CM | POA: Insufficient documentation

## 2016-10-07 DIAGNOSIS — I10 Essential (primary) hypertension: Secondary | ICD-10-CM | POA: Insufficient documentation

## 2016-10-07 DIAGNOSIS — E119 Type 2 diabetes mellitus without complications: Secondary | ICD-10-CM | POA: Insufficient documentation

## 2016-10-07 DIAGNOSIS — Z79899 Other long term (current) drug therapy: Secondary | ICD-10-CM | POA: Insufficient documentation

## 2016-10-07 DIAGNOSIS — M25551 Pain in right hip: Secondary | ICD-10-CM | POA: Diagnosis not present

## 2016-10-07 MED ORDER — AMLODIPINE BESYLATE 5 MG PO TABS
5.0000 mg | ORAL_TABLET | Freq: Once | ORAL | Status: AC
  Administered 2016-10-07: 5 mg via ORAL
  Filled 2016-10-07: qty 1

## 2016-10-07 MED ORDER — LIDOCAINE 5 % EX PTCH: 1.0000 | MEDICATED_PATCH | CUTANEOUS | 0 refills | Status: DC

## 2016-10-07 MED ORDER — HYDROMORPHONE HCL 1 MG/ML IJ SOLN
1.0000 mg | Freq: Once | INTRAMUSCULAR | Status: AC
  Administered 2016-10-07: 1 mg via INTRAMUSCULAR
  Filled 2016-10-07: qty 1

## 2016-10-07 NOTE — Discharge Instructions (Signed)
Please continue taking your blood pressure medicine. Continue to treat your pain with Norco that you have been prescribed by your spine doctor and the muscle relaxer to help with spasm.   Please schedule an appointment with your spine doctor to manage your pain on Monday.   Please return to the Emergency Department if you develop numbness in your legs, lose continence, can no longer walk or have new or worsening symptoms.

## 2016-10-07 NOTE — ED Provider Notes (Signed)
[facility] DEPT Provider Note   CSN: [id number] Arrival date & time: 10/07/16  1830     History   Chief Complaint Chief Complaint  Patient presents with  . Back Pain    HPI Pamela Warner is a 71 y.o. female.  HPI   Pamela Warner is a 71yo female with a history of laser spine surgery (07/2016), type 1 DM, hypertension, hyperlipidemia who presents to the ER for evaluation of worsening right hip pain. Patient states that Pamela Warner had laser spine surgery performed in [place] approximately 3 months ago where they "released my pinched sciatic nerve." Pamela Warner states that her symptoms resolved for several weeks after the surgery, but then started developing right hip pain which is similar to the symptoms Pamela Warner had preoperatively. Pamela Warner states that Pamela Warner was told to see a PT and went to her first appointment this week. Ever since Pamela Warner had PT her pain has become excruciating. Pamela Warner thinks that the exercises from PT have made the pain in her right hip exponentially worse. At this time Pamela Warner has 10/10, cramping pain which begins on the right buttock and radiates over the anterior thigh. Pamela Warner denies back pain. Pain is worsened with sitting for prolonged periods or with walking. Pamela Warner states that Pamela Warner cannot take more than 20 steps with her pain. Pamela Warner denies numbness, tingling, weakness. Pamela Warner reports that Pamela Warner gets Norco from her pain doctor in [facility] and from her spinal doctor in [place]. States that the "norco doesn't touch my pain." Pamela Warner is asking for stronger pain medication and asking for a prescription for stronger opioid pain medication. Pamela Warner states that Pamela Warner has not taken her blood pressure medication today and is "not concerned that my blood pressure is above 200." Pamela Warner denies chest pain, SOB, vision changes, difficulty urinating, saddle anesthesia, abdominal pain, N/V, headache. Denies history of cancer, IVDU.   Past Medical History:  Diagnosis Date  . Diabetes mellitus    type 1  . Essential hypertension   . Hyperlipidemia      Patient Active Problem List   Diagnosis Date Noted  . Chest pain 02/15/2011  . Murmur 02/15/2011  . Hypertension 02/15/2011  . Hyperlipidemia 02/15/2011    Past Surgical History:  Procedure Laterality Date  . APPENDECTOMY    . BACK SURGERY    . CESAREAN SECTION     x2  . OTHER SURGICAL HISTORY  2004   cardiolite    OB History    No data available       Home Medications    Prior to Admission medications   Medication Sig Start Date End Date Taking? Authorizing Provider  amLODipine (NORVASC) 2.5 MG tablet Take 2.5 mg by mouth daily.   Yes [provider]  escitalopram (LEXAPRO) 10 MG tablet Take 10 mg by mouth daily. 09/13/16  Yes [provider]  estrogens, conjugated, (PREMARIN) 0.625 MG tablet Take 0.625 mg by mouth daily. Take daily for 21 days then do not take for 7 days.   Yes [provider]  gabapentin (NEURONTIN) 100 MG capsule Take 100 mg by mouth See admin instructions. Take one capsule at bedtime for three days then take 100mg  twicw a day for 3 days then take 100 mg three times daily   Yes [provider]  GLUCAGON EMERGENCY 1 MG injection as directed. 02/13/11  Yes [provider]  HYDROcodone-acetaminophen (NORCO/VICODIN) 5-325 MG tablet Take 1-2 tablets by mouth every 6 (six) hours as needed. 07/19/16  Yes [provider]  insulin glargine (  LANTUS) 100 UNIT/ML injection Inject into the skin at bedtime. 12-14 units in the morning   Yes [provider]  insulin NPH (HUMULIN N,NOVOLIN N) 100 UNIT/ML injection Inject into the skin. Sliding scale   Yes [provider]  insulin regular (HUMULIN R) 100 units/mL injection Inject into the skin 3 (three) times daily before meals. 8-9 unints at night 1 u it all depends   Yes [provider]  methocarbamol (ROBAXIN) 750 MG tablet Take 750 mg by mouth 4 (four) times daily as needed. 07/19/16  Yes [provider]  Multiple Vitamin  (MULTI-VITAMIN PO) Take by mouth daily.   Yes [provider]  omeprazole (PRILOSEC) 20 MG capsule Take 20 mg by mouth daily. 05/09/12  Yes [provider]  potassium chloride SA (K-DUR,KLOR-CON) 20 MEQ tablet Take 1 tablet (20 mEq total) by mouth 2 (two) times daily. Patient taking differently: Take 20 mEq by mouth daily.  05/15/11 10/07/16 Yes Larey Dresser, MD  simvastatin (ZOCOR) 20 MG tablet Take 20 mg by mouth every evening.   Yes [provider]  timolol (TIMOPTIC) 0.5 % ophthalmic solution Place 1 drop into the left eye 2 (two) times daily. 01/18/16  Yes [provider]  chlorthalidone (HYGROTON) 25 MG tablet Take 1 tablet (25 mg total) by mouth daily. 04/28/11 04/27/12  Larey Dresser, MD    Family History History reviewed. No pertinent family history.  Social History Social History  Substance Use Topics  . Smoking status: Never Smoker  . Smokeless tobacco: Never Used  . Alcohol use Yes     Allergies   Acetaminophen   Review of Systems Review of Systems  Constitutional: Negative for chills, fatigue and fever.  Eyes: Negative for visual disturbance.  Respiratory: Negative for cough and shortness of breath.   Cardiovascular: Negative for chest pain and palpitations.  Gastrointestinal: Negative for abdominal pain, diarrhea, nausea and vomiting.  Genitourinary: Negative for difficulty urinating, dysuria, frequency, hematuria and urgency.  Musculoskeletal: Positive for arthralgias (right hip) and gait problem (pain with ambulating). Negative for back pain.  Skin: Negative for rash and wound.  Neurological: Negative for dizziness, weakness, light-headedness, numbness and headaches.  Psychiatric/Behavioral: Negative for agitation.     Physical Exam Updated Vital Signs BP (!) 152/67 (BP Location: Left Arm) Comment: Simultaneous filing. User may not have seen previous data.  Pulse 79 Comment: Simultaneous filing. User may not have seen  previous data.  Temp 97.7 F (36.5 C) (Oral)   Resp 19 Comment: Simultaneous filing. User may not have seen previous data.  Ht 5\' 2"  (1.575 m)   Wt 64.9 kg (143 lb)   SpO2 99% Comment: Simultaneous filing. User may not have seen previous data.  BMI 26.16 kg/m   Physical Exam  Constitutional: Pamela Warner is oriented to person, place, and time. Pamela Warner appears well-developed and well-nourished.  Patient is tearful throughout encounter.   HENT:  Head: Normocephalic and atraumatic.  Mouth/Throat: Oropharynx is clear and moist. No oropharyngeal exudate.  Eyes: Pupils are equal, round, and reactive to light. EOM are normal. Right eye exhibits no discharge. Left eye exhibits no discharge.  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   Neck: Normal range of motion. Neck supple.  Cardiovascular: Normal rate and regular rhythm.  Exam reveals no gallop and no friction rub.   Murmur (systolic grade 3/6) heard. Pulmonary/Chest: Effort normal and breath sounds normal. No respiratory distress. Pamela Warner has no wheezes. Pamela Warner has no rales.  Abdominal:  Soft. Bowel sounds are normal. There is no tenderness. There is no guarding.  Musculoskeletal: Normal range of motion.  Midline surgical scar in the lumbar spine, well healed. No tenderness to palpation over the spinous processes of the thoracic or lumbar spine. No tenderness over paraspinal muscles of the lumbar spine. Patient has full ROM of bilateral hips, knees, ankle although painful.   Lymphadenopathy:    Pamela Warner has no cervical adenopathy.  Neurological: Pamela Warner is alert and oriented to person, place, and time.  Mental Status:  Alert, oriented, thought content appropriate, able to give a coherent history. Speech fluent without evidence of aphasia. Able to follow 2 step commands without difficulty.  Cranial Nerves:  II:  Peripheral visual fields grossly normal, pupils equal, round, reactive to light III,IV, VI: ptosis not present, extra-ocular motions intact  bilaterally  V,VII: smile symmetric, facial light touch sensation equal VIII: hearing grossly normal to voice  X: uvula elevates symmetrically  XI: bilateral shoulder shrug symmetric and strong XII: midline tongue extension without fassiculations Motor:  Normal tone. 5/5 in upper and lower extremities bilaterally including strong and equal grip strength and dorsiflexion/plantar flexion Sensory: Pinprick and light touch normal in all extremities.  Deep Tendon Reflexes: 2+ and symmetric in the biceps and patella Cerebellar: normal finger-to-nose with bilateral upper extremities Gait: normal gait and balance, although very painful CV: distal pulses palpable throughout    Skin: Skin is warm and dry. Pamela Warner is not diaphoretic.  Psychiatric: Pamela Warner has a normal mood and affect. Her behavior is normal.  Nursing note and vitals reviewed.    ED Treatments / Results  Labs (all labs ordered are listed, but only abnormal results are displayed) Labs Reviewed - No data to display  EKG  EKG Interpretation None       Radiology No results found.  Procedures Procedures (including critical care time)  Medications Ordered in ED Medications  amLODipine (NORVASC) tablet 5 mg (5 mg Oral Given 10/07/16 2211)  HYDROmorphone (DILAUDID) injection 1 mg (1 mg Intramuscular Given 10/07/16 2212)     Initial Impression / Assessment and Plan / ED Course  I have reviewed the triage vital signs and the nursing notes.  Pertinent labs & imaging results that were available during my care of the patient were reviewed by me and considered in my medical decision making (see chart for details).    Patient with chronic right hip pain who is requesting stronger pain medication than the Norco Pamela Warner has been prescribed by her spine specialist. Discussed the fact that we do not treat chronic pain in the Emergency Department. Have counseled patient to follow up with her spine specialist for further pain management. Have  prescribed lidocaine patch, will not prescribe opioid pain medication for her to go home with.   No neurological deficits and normal neuro exam.  Patient can walk but states is painful. No loss of bowel or bladder control. No concern for cauda equina.  No fever, night sweats, weight loss, h/o cancer, IVDU.   Patient was hypertensive in the ER today, likely due to her pain and the fact that Pamela Warner did not take her home blood pressure medication today. No headache, no focal neurological deficit on exam, no chest pain, no visual disturbance. Do not suspect hypertensive emergency. Gave patient her home blood pressure medication in the ER and blood pressure came down to 152/67. Patient has been counseled that Pamela Warner needs to follow up with her primary doctor regarding blood pressure recheck.  Patient is able  to ambulate independently, although painful prior to discharge. Discussed return precautions and patient agrees and voices understanding. Dr. Kathrynn Humble also saw this patient and agrees with plan to discharge.   Final Clinical Impressions(s) / ED Diagnoses   Final diagnoses:  Right hip pain    New Prescriptions New Prescriptions   No medications on file     Bernarda Caffey 10/08/16 0133    Varney Biles, MD 10/08/16 1726

## 2016-10-07 NOTE — ED Notes (Signed)
Provider notified of patients pain and request for medication

## 2016-10-07 NOTE — ED Triage Notes (Addendum)
Patient reports having back surgery on July 13th in [place] for pinched nerve. States she started having pain again and was instructed to go to PT. Patient had PT on Monday and states that her pain has worsened. Reports severe right sided lower back pain. Reports pain so severe she has been vomiting. Patient states she has also had increased in CBGs at home.   Patient has been taking aleve, Vicodin, and robaxin without relief.

## 2016-10-12 DIAGNOSIS — M545 Low back pain: Secondary | ICD-10-CM | POA: Diagnosis not present

## 2016-10-13 DIAGNOSIS — Z6826 Body mass index (BMI) 26.0-26.9, adult: Secondary | ICD-10-CM | POA: Diagnosis not present

## 2016-10-13 DIAGNOSIS — M5416 Radiculopathy, lumbar region: Secondary | ICD-10-CM | POA: Diagnosis not present

## 2016-10-16 DIAGNOSIS — M545 Low back pain: Secondary | ICD-10-CM | POA: Diagnosis not present

## 2016-10-18 DIAGNOSIS — M545 Low back pain: Secondary | ICD-10-CM | POA: Diagnosis not present

## 2016-10-24 DIAGNOSIS — M545 Low back pain: Secondary | ICD-10-CM | POA: Diagnosis not present

## 2016-10-27 DIAGNOSIS — M545 Low back pain: Secondary | ICD-10-CM | POA: Diagnosis not present

## 2016-11-03 ENCOUNTER — Other Ambulatory Visit: Payer: Self-pay | Admitting: Neurological Surgery

## 2016-11-03 ENCOUNTER — Ambulatory Visit
Admission: RE | Admit: 2016-11-03 | Discharge: 2016-11-03 | Disposition: A | Discharge status: Home / Self Care | Payer: Medicare Other | Source: Ambulatory Visit | Attending: Neurological Surgery | Admitting: Neurological Surgery

## 2016-11-03 DIAGNOSIS — M5417 Radiculopathy, lumbosacral region: Secondary | ICD-10-CM

## 2016-11-03 DIAGNOSIS — M545 Low back pain: Secondary | ICD-10-CM | POA: Diagnosis not present

## 2016-11-07 ENCOUNTER — Ambulatory Visit: Payer: Self-pay

## 2016-11-07 ENCOUNTER — Ambulatory Visit (INDEPENDENT_AMBULATORY_CARE_PROVIDER_SITE_OTHER): Payer: Medicare Other | Admitting: Sports Medicine

## 2016-11-07 VITALS — BP 160/80 | HR 71 | Ht 62.0 in | Wt 145.0 lb

## 2016-11-07 DIAGNOSIS — M51369 Other intervertebral disc degeneration, lumbar region without mention of lumbar back pain or lower extremity pain: Secondary | ICD-10-CM

## 2016-11-07 DIAGNOSIS — M5136 Other intervertebral disc degeneration, lumbar region: Secondary | ICD-10-CM | POA: Diagnosis not present

## 2016-11-07 DIAGNOSIS — M7601 Gluteal tendinitis, right hip: Secondary | ICD-10-CM | POA: Diagnosis not present

## 2016-11-07 DIAGNOSIS — M25551 Pain in right hip: Secondary | ICD-10-CM | POA: Diagnosis not present

## 2016-11-07 NOTE — Progress Notes (Addendum)
OFFICE VISIT NOTE [name]. Meela Wareing, [place] [facility]  SAHEJ HAUSWIRTH - 71 y.o. female MRN [id number]  Date of birth: [birth year]  Visit Date: 11/07/2016  PCP: Burnard Bunting, MD   Referred by: Burnard Bunting, MD  Jari Sportsman, cma acting as scribe for Dr. Paulla Fore.  SUBJECTIVE:   Chief Complaint  Patient presents with  . New Patient (Initial Visit)    RT HIP PAIN   HPI: As below and per problem based documentation when appropriate.   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 Spinally surgery in July 2018 due to a pinched nerve [facility] [place], [place]. Hip pain started 09-01-2016 while on vacation.  Walking and being on her feet triggers her weakness and pain described as twisting. Massage the area helps relieve the pain. Location is the inner hip radiating to her groin area. Tried lidocaine patches, methocarbamol, advil, and hydrocodone with relief. Sx are recurrent after medication wears off. Recent MRI lumbar spin w wo contrast performed on 11/03/2016. PT performed at [place] with minimal relief.     ROS  Otherwise per HPI.   HISTORY & PERTINENT PRIOR DATA:  Prior History reviewed and updated per electronic medical record.  Significant history, findings, studies and interim changes include:  reports that  has never smoked. she has never used smokeless tobacco. No results for input(s): HGBA1C, LABURIC, CREATINE in the last 8760 hours. No specialty comments available. Problem  Degeneration of Lumbar Intervertebral Disc   S/p [facility] Sp[facility] Surgery [facility] [place] in July [facility]  MRI 11/03/16 -  1. Recurrent large right foraminal/extraforaminal disc extrusion at L4-5 with extraforaminal right L4 nerve root encroachment. 2. Mild multifactorial spinal stenosis at L3-4 and L4-5. 3. No other evidence of nerve root encroachment.   Gluteal Tendinitis, Right Hip      OBJECTIVE:  VS:  HT:5\' 2"  (157.5 cm)   WT:145 lb (65.8 kg)  BMI:26.51    BP:(!) 160/80  HR:71bpm  TEMP: ( )  RESP:98 %  PHYSICAL EXAM: Constitutional: WDWN, Non-toxic appearing. Psychiatric: Alert & appropriately interactive. Not depressed or anxious appearing. Respiratory: No increased work of breathing. Trachea Midline Eyes: Pupils are equal. EOM intact without nystagmus. No scleral icterus Cardiovascular:  Peripheral Pulses: peripheral pulses symmetrical No clubbing or cyanosis appreciated Capillary Refill is normal, less than 2 seconds No signficant generalized edema/anasarca Sensory Exam: intact to light touch  Back & Lower Extremities:  Tightness with bilateral straight leg raises however this is mild  No significant midline tenderness.    Well-healed postsurgical incision  Gluteal musculature and tendon belly  Good internal and external rotation of the hips.  Manual muscle testing is 5+/5 in BLE myotomes without focality  Lower extremity DTRs 2+/4 diffusely and symmetric  Negative Stinchfield testing, FADIR, Faber  No additional findings.   ASSESSMENT & PLAN:   1. Right hip pain   2. Gluteal tendinitis, right hip   3. Degeneration of lumbar intervertebral disc    PLAN: Patient ultimately has symptoms consistent with glute medius dysfunction and glute tendinopathy.  She did have some dysfunction of the musculature on MSK ultrasound and this was injected under direct guidance today per procedure note.  We will plan to check in with her in 2 weeks and see if she is doing at that time.  Therapeutic exercises were provided today and reviewed in detail with the athletic training staff.  Symptoms do  seem more consistent with a glucose dysfunction versus greater trochanteric syndrome than a recurrent herniated disc although she does have significant changes on her MRI.  No neural tension signs at this time. Degeneration of lumbar intervertebral disc PROCEDURE NOTE:  THERAPEUTIC EXERCISES (97110) 15 minutes spent for Therapeutic exercises as below and as referenced in the AVS. This included exercises focusing on stretching, strengthening, with significant focus on eccentric aspects.  Proper technique shown and discussed handout in great detail with ATC. All questions were discussed and answered.   Long term goals include an improvement in range of motion, strength, endurance as well as avoiding reinjury. Frequency of visits is one time as determined during today's  office visit. Frequency of exercises to be performed is as per handout.  EXERCISES REVIEWED:  Core strengthening exercises and generalized abdominal bracing   Gluteal tendinitis, right hip PROCEDURE NOTE -  ULTRASOUND GUIDEDInjection: Right Glute/Greater Troch Images were obtained and interpreted by myself, Teresa Coombs, DO  Images have been saved and stored to PACS system. Images obtained on: GE S7 Ultrasound machine  ULTRASOUND FINDINGS:  Hypoechoic change within the glute medius muscle belly consistent with a chronic strain and dysfunction  DESCRIPTION OF PROCEDURE:  The patient's clinical condition is marked by substantial pain and/or significant functional disability. Other conservative therapy has not provided relief, is contraindicated, or not appropriate. There is a reasonable likelihood that injection will significantly improve the patient's pain and/or functional impairment.  After discussing the risks, benefits and expected outcomes of the injection and all questions were reviewed and answered, the patient wished to undergo the above named procedure. Verbal consent was obtained.  The ultrasound was used to identify the target structure and adjacent neurovascular structures. The skin was then prepped in sterile fashion and the target structure was injected under direct visualization using sterile technique as below:  Right PREP: Alcohol, Ethel Chloride,  APPROACH:  direct, stopcock technique, 22g 3.5in. INJECTATE: 5cc 1% lidocaine, 2cc 0.5% marcaine, 2cc 40mg /mL DepoMedrol  ASPIRATE: N/a  DRESSING: Band-Aid    Post procedural instructions including recommending icing and warning signs for infection were reviewed.  This procedure was well tolerated and there were no complications.   IMPRESSION: Succesful US Guided Injection   ++++++++++++++++++++++++++++++++++++++++++++ Orders & Meds: Orders Placed This Encounter  Procedures  . [place] LIMITED JOINT SPACE STRUCTURES UP RIGHT(NO LINKED CHARGES)    No orders of the defined types were placed in this encounter.   ++++++++++++++++++++++++++++++++++++++++++++ Follow-up: Return in about 2 weeks (around 11/21/2016).   Pertinent documentation may be included in additional procedure notes, imaging studies, problem based documentation and patient instructions. Please see these sections of the encounter for additional information regarding this visit. CMA/ATC served as Education administrator during this visit. History, Physical, and Plan performed by medical provider. Documentation and orders reviewed and attested to.      [name], [facility] Physician

## 2016-11-07 NOTE — Patient Instructions (Signed)
Please perform the exercise program that we have prepared for you and gone over in detail on a daily basis.  In addition to the handout you were provided you can access your program through: [email or web address]   Your unique program code is: [email or web address]

## 2016-11-15 ENCOUNTER — Telehealth: Payer: Self-pay | Admitting: Sports Medicine

## 2016-11-15 NOTE — Telephone Encounter (Signed)
Patient having right hip pain. Would like to know what Pamela Warner recommends.  Please advise,  Ty, -LL

## 2016-11-16 DIAGNOSIS — I10 Essential (primary) hypertension: Secondary | ICD-10-CM | POA: Diagnosis not present

## 2016-11-16 DIAGNOSIS — M5126 Other intervertebral disc displacement, lumbar region: Secondary | ICD-10-CM | POA: Diagnosis not present

## 2016-11-16 DIAGNOSIS — Z6826 Body mass index (BMI) 26.0-26.9, adult: Secondary | ICD-10-CM | POA: Diagnosis not present

## 2016-11-16 NOTE — Telephone Encounter (Signed)
Spoke with pt and she advised that her hip did get a little better after her injection 11/07/16 but pain seems to have gotten worse again over the past couple of days. She has a follow-up appointment scheduled for next week. She wanted to know if there is anything she can do in the meantime to help with the pain.

## 2016-11-20 NOTE — Telephone Encounter (Signed)
Will need to be seen tomorrow and discuss options at that time

## 2016-11-20 NOTE — Telephone Encounter (Signed)
Called and left VM to call the office

## 2016-11-21 ENCOUNTER — Encounter: Payer: Self-pay | Admitting: Sports Medicine

## 2016-11-21 ENCOUNTER — Ambulatory Visit: Payer: Medicare Other | Admitting: Sports Medicine

## 2016-11-21 ENCOUNTER — Ambulatory Visit (INDEPENDENT_AMBULATORY_CARE_PROVIDER_SITE_OTHER): Payer: Medicare Other | Admitting: Sports Medicine

## 2016-11-21 VITALS — BP 122/60 | HR 67 | Ht 62.0 in | Wt 144.4 lb

## 2016-11-21 DIAGNOSIS — L918 Other hypertrophic disorders of the skin: Secondary | ICD-10-CM | POA: Diagnosis not present

## 2016-11-21 DIAGNOSIS — M7601 Gluteal tendinitis, right hip: Secondary | ICD-10-CM | POA: Diagnosis not present

## 2016-11-21 DIAGNOSIS — L814 Other melanin hyperpigmentation: Secondary | ICD-10-CM | POA: Diagnosis not present

## 2016-11-21 DIAGNOSIS — D2221 Melanocytic nevi of right ear and external auricular canal: Secondary | ICD-10-CM | POA: Diagnosis not present

## 2016-11-21 DIAGNOSIS — R208 Other disturbances of skin sensation: Secondary | ICD-10-CM | POA: Diagnosis not present

## 2016-11-21 DIAGNOSIS — D225 Melanocytic nevi of trunk: Secondary | ICD-10-CM | POA: Diagnosis not present

## 2016-11-21 DIAGNOSIS — L821 Other seborrheic keratosis: Secondary | ICD-10-CM | POA: Diagnosis not present

## 2016-11-21 DIAGNOSIS — D1801 Hemangioma of skin and subcutaneous tissue: Secondary | ICD-10-CM | POA: Diagnosis not present

## 2016-11-21 DIAGNOSIS — Z85828 Personal history of other malignant neoplasm of skin: Secondary | ICD-10-CM | POA: Diagnosis not present

## 2016-11-21 DIAGNOSIS — M25551 Pain in right hip: Secondary | ICD-10-CM | POA: Diagnosis not present

## 2016-11-21 DIAGNOSIS — M5136 Other intervertebral disc degeneration, lumbar region: Secondary | ICD-10-CM | POA: Diagnosis not present

## 2016-11-21 DIAGNOSIS — D692 Other nonthrombocytopenic purpura: Secondary | ICD-10-CM | POA: Diagnosis not present

## 2016-11-21 MED ORDER — NITROGLYCERIN 0.2 MG/HR TD PT24: MEDICATED_PATCH | TRANSDERMAL | 1 refills | Status: DC

## 2016-11-21 NOTE — Patient Instructions (Addendum)
Nitroglycerin Protocol   Apply 1/4 nitroglycerin patch to affected area daily.  Change position of patch within the affected area every 24 hours.  You may experience a headache during the first 1-2 weeks of using the patch, these should subside.  If you experience headaches after beginning nitroglycerin patch treatment, you may take your preferred over the counter pain reliever.  Another side effect of the nitroglycerin patch is skin irritation or rash related to patch adhesive.  Please notify our office if you develop more severe headaches or rash, and stop the patch.  Tendon healing with nitroglycerin patch may require 12 to 24 weeks depending on the extent of injury.  Men should not use if taking Viagra, Cialis, or Levitra.   Do not use if you have migraines or rosacea.     Please perform the exercise program that we have prepared for you and gone over in detail on a daily basis.  In addition to the handout you were provided you can access your program through: [email or web address]   Your unique program code is: [email or web address]

## 2016-11-21 NOTE — Progress Notes (Signed)
OFFICE VISIT NOTE Pamela Warner, [facility] [facility] [place]  Pamela Warner - 71 y.o. female MRN [id number]  Date of birth: 03-24-1945  Visit Date: 11/21/2016  PCP: Pamela Bunting, MD   Referred by: Pamela Bunting, MD  Pamela Warner PT, LAT, ATC acting as scribe for Dr. Paulla Warner.  SUBJECTIVE:   Chief Complaint  Patient presents with  . Follow-up    R hip pain   HPI: As below and per problem based documentation when appropriate.  Pamela Warner is an established pt presenting today for f/u of her R hip pain.  She was last seen on 11/07/16 and was given an HEP consisting of hip flexor stretching, sidelying clamshells and sidelying hip aBd.  Pt states that she has been feeling much better since her last visit.  She notes that she is able to take a shower and brush her teeth by herself at this point and is able to stand and walk for longer periods of time.  She reports feeling 50% improved.  She states that she has noticed the most improvement over the past 4 days.  She notes that she can now walk approximately 100 feet w/o an increase in symptoms.    ROS  Otherwise per HPI.  HISTORY & PERTINENT PRIOR DATA:  No specialty comments available. She reports that  has never smoked. she has never used smokeless tobacco. No results for input(s): HGBA1C, LABURIC, CREATINE in the last 8760 hours.  Invalid input(s): CR Allergies reviewed per EMR Prior to Admission medications   Medication Sig Start Date End Date Taking? Authorizing Provider  amLODipine (NORVASC) 2.5 MG tablet Take 2.5 mg by mouth daily.   Yes [provider]  escitalopram (LEXAPRO) 10 MG tablet Take 10 mg by mouth daily. 09/13/16  Yes [provider]  estrogens, conjugated, (PREMARIN) 0.625 MG tablet Take 0.625 mg by mouth daily. Take daily for 21 days then do not take for 7 days.   Yes [provider]  GLUCAGON EMERGENCY 1 MG injection as  directed. 02/13/11  Yes [provider]  HYDROcodone-acetaminophen (NORCO/VICODIN) 5-325 MG tablet Take 1-2 tablets by mouth every 6 (six) hours as needed. 07/19/16  Yes [provider]  insulin glargine (LANTUS) 100 UNIT/ML injection Inject into the skin at bedtime. 12-14 units in the morning   Yes [provider]  insulin NPH (HUMULIN N,NOVOLIN N) 100 UNIT/ML injection Inject into the skin. Sliding scale   Yes [provider]  insulin regular (HUMULIN R) 100 units/mL injection Inject into the skin 3 (three) times daily before meals. 8-9 unints at night 1 u it all depends   Yes [provider]  methocarbamol (ROBAXIN) 750 MG tablet Take 750 mg by mouth 4 (four) times daily as needed. 07/19/16  Yes [provider]  Multiple Vitamin (MULTI-VITAMIN PO) Take by mouth daily.   Yes [provider]  omeprazole (PRILOSEC) 20 MG capsule Take 20 mg by mouth daily. 05/09/12  Yes [provider]  simvastatin (ZOCOR) 20 MG tablet Take 20 mg by mouth every evening.   Yes [provider]  timolol (TIMOPTIC) 0.5 % ophthalmic solution Place 1 drop into the left eye 2 (two) times daily. 01/18/16  Yes [provider]  chlorthalidone (HYGROTON) 25 MG tablet Take 1 tablet (25 mg total) by mouth daily. 04/28/11 04/27/12  Larey Dresser, MD  nitroGLYCERIN (NITRODUR - DOSED IN MG/24 HR) 0.2 mg/hr patch Place 1/4  to 1/2 of a patch over affected region. Remove and replace once daily.  Slightly alter skin placement daily 11/21/16   Pamela Diss, DO  potassium chloride SA (K-DUR,KLOR-CON) 20 MEQ tablet Take 1 tablet (20 mEq total) by mouth 2 (two) times daily. Patient taking differently: Take 20 mEq by mouth daily.  05/15/11 10/07/16  Larey Dresser, MD   Patient Active Problem List   Diagnosis Date Noted  . Degeneration of lumbar intervertebral disc 11/22/2016  . Gluteal tendinitis, right hip 11/21/2016  . 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 Primary open angle glaucoma of both eyes 10/14/2014  . Diabetic macular edema of both eyes, due to underlying condition, with cataract ([place]) 04/22/2014  . Iritis 10/17/2013  . Pseudophakia, right eye 06/20/2013  . Rubeosis iridis 11/20/2012  . Endothelial corneal dystrophy 07/02/2012  . Chest pain 02/15/2011  . Murmur 02/15/2011  . Hypertension 02/15/2011  . Hyperlipidemia 02/15/2011   Past Medical History:  Diagnosis Date  . Diabetes mellitus    type 1  . Essential hypertension   . Hyperlipidemia    History reviewed. No pertinent family history. Past Surgical History:  Procedure Laterality Date  . APPENDECTOMY    . BACK SURGERY    . CESAREAN SECTION     x2  . OTHER SURGICAL HISTORY  2004   cardiolite   Social History   Occupational History  . Not on file  Tobacco Use  . Smoking status: Never Smoker  . Smokeless tobacco: Never Used  Substance and Sexual Activity  . Alcohol use: Yes  . Drug use: No  . Sexual activity: Not on file    OBJECTIVE:  VS:  HT:5\' 2"  (157.5 cm)   WT:144 lb 6.4 oz (65.5 kg)  BMI:26.4    BP:122/60  HR:67bpm  TEMP: ( )  RESP:97 % EXAM: Findings:  Bilateral lower extremities overall well aligned she has good internal and external rotation of bilateral hips.  Negative logroll.  Normal straight leg raise.  Negative Stinchfield.  She has marked pain with palpation of the right greater trochanter and right gluteal musculature.  She has a small amount of cramping and discomfort with even isometric glute medius recruitment.  No appreciable palpable mass in her buttock or lateral hip.  Her postsurgical incision is well-healed without evidence of erythema.    RADIOLOGY: MR Lumbar Spine W Wo Contrast CLINICAL DATA:  Right leg and hip pain for 2 months. Back surgery 3 months ago.  Creatinine was obtained on  site at [facility] at [address]  Results: Creatinine 0.8 mg/dL.  EXAM: MRI LUMBAR SPINE WITHOUT AND WITH CONTRAST  TECHNIQUE: Multiplanar and multiecho pulse sequences of the lumbar spine were obtained without and with intravenous contrast.  CONTRAST:  13 ml MultiHance.  COMPARISON:  None.  FINDINGS: Despite efforts by the technologist and patient, mild motion artifact is present on today's exam and could not be eliminated. This reduces exam sensitivity and specificity. Motion was due to patient pain and could not be eliminated. Segmentation: Conventional anatomy assumed, with the last open disc space designated L5-S1.  Alignment: Mild straightening without focal angulation or significant listhesis.  Vertebrae: No worrisome osseous lesion, acute fracture or pars defect. Schmorl's nodes are present at multiple levels. The visualized sacroiliac joints appear unremarkable.  Conus medullaris: Extends to the T12-L1 level and appears normal. No abnormal intradural enhancement.  Paraspinal and other soft tissues: No significant paraspinal findings. Expected paraspinal enhancement at the operative level (L4-5 on the right).  Disc levels:  L1-2: Mild disc bulging. No spinal stenosis or nerve root encroachment.  L2-3: Mild disc bulging. No spinal stenosis or nerve root encroachment.  L3-4: There is loss of disc height with annular disc bulging and mild endplate degeneration. There is mild facet and ligamentous hypertrophy. These factors contribute mild spinal stenosis. There is no nerve root encroachment.  L4-5: Right-sided laminectomy defect is noted with enhancement in the surgical bed. There is loss of disc height with annular disc bulging. Best seen on the sagittal images are findings most consistent with a large right foraminal and extraforaminal disc extrusion with peripheral enhancement following contrast. This measures up to 19 mm diameter on  sagittal image 2. There is effacement of the fat in the right foramen and extraforaminal right L4 nerve root encroachment. The left foramen is mildly narrowed. There is mild facet and ligamentous hypertrophy contributing to mild spinal stenosis.  L5-S1: Mild disc bulging and facet hypertrophy. No spinal stenosis or nerve root encroachment.  IMPRESSION: 1. Recurrent large right foraminal/extraforaminal disc extrusion at L4-5 with extraforaminal right L4 nerve root encroachment. 2. Mild multifactorial spinal stenosis at L3-4 and L4-5. 3. No other evidence of nerve root encroachment.  Electronically Signed   By: Richardean Sale M.D.   On: 11/03/2016 20:20  ASSESSMENT & PLAN:     ICD-10-CM   1. Gluteal tendinitis, right hip M76.01 nitroGLYCERIN (NITRODUR - DOSED IN MG/24 HR) 0.2 mg/hr patch    Misc procedure  2. Right hip pain M25.551   3. Degeneration of lumbar intervertebral disc M51.36    ================================================================= Gluteal tendinitis, right hip She has slowly improved however has significant weakness in the hip abductor's.  Prior MSK ultrasound last visit revealed small amount of bursitis with underlying glute tendinopathy and we will have her begin on nitroglycerin protocol at this time.  Therapeutic exercises were reviewed again today and modified for her to be able to perform them and we emphasized the importance of doing these on a daily basis.  Degeneration of lumbar intervertebral disc Small amount of right L4 nerve root impingement noted on her MRI.  She has had good improvement with a greater trochanteric injection and we will have her begin on glute medius therapeutic exercises. If any reproducible radicular symptoms similar to prior to surgery could consider repeat epidural steroid injection however her symptoms do seem to be more related to Tendinopathic changes as a localized to the buttock and lateral hip only with no  radiation.  PROCEDURE NOTE: THERAPEUTIC EXERCISES (97110) 15 minutes spent for Therapeutic exercises as below and as referenced in the AVS. This included exercises focusing on stretching, strengthening, with significant focus on eccentric aspects.  Proper technique shown and discussed handout in great detail with ATC. All questions were discussed and answered.   Long term goals include an improvement in range of motion, strength, endurance as well as avoiding reinjury. Frequency of visits is one time as determined during today's  office visit. Frequency of exercises to be performed is as per handout.  EXERCISES REVIEWED:  Hip abductor isometrics  Hip flexor stretching  ================================================================= Patient Instructions  Nitroglycerin Protocol   Apply 1/4 nitroglycerin patch to affected area daily.  Change position of patch within the affected area every 24 hours.  You may experience a headache during the first 1-2 weeks of using the patch, these should subside.  If you  experience headaches after beginning nitroglycerin patch treatment, you may take your preferred over the counter pain reliever.  Another side effect of the nitroglycerin patch is skin irritation or rash related to patch adhesive.  Please notify our office if you develop more severe headaches or rash, and stop the patch.  Tendon healing with nitroglycerin patch may require 12 to 24 weeks depending on the extent of injury.  Men should not use if taking Viagra, Cialis, or Levitra.   Do not use if you have migraines or rosacea.     Please perform the exercise program that we have prepared for you and gone over in detail on a daily basis.  In addition to the handout you were provided you can access your program through: [email or web address]   Your unique program code is: [email or web address]  ================================================================= No future  appointments.  Follow-up: Return in about 6 weeks (around 01/02/2017).   CMA/ATC served as Education administrator during this visit. History, Physical, and Plan performed by medical provider. Documentation and orders reviewed and attested to.      Teresa Coombs, [facility] Physician

## 2016-11-21 NOTE — Telephone Encounter (Signed)
Unable to reach pt by phone, will discuss at [place] today.

## 2016-11-22 ENCOUNTER — Encounter: Payer: Self-pay | Admitting: Sports Medicine

## 2016-11-22 DIAGNOSIS — M5136 Other intervertebral disc degeneration, lumbar region: Secondary | ICD-10-CM | POA: Insufficient documentation

## 2016-11-22 DIAGNOSIS — M51369 Other intervertebral disc degeneration, lumbar region without mention of lumbar back pain or lower extremity pain: Secondary | ICD-10-CM | POA: Insufficient documentation

## 2016-11-22 NOTE — Assessment & Plan Note (Signed)
Small amount of right L4 nerve root impingement noted on her MRI.  She has had good improvement with a greater trochanteric injection and we will have her begin on glute medius therapeutic exercises. If any reproducible radicular symptoms similar to prior to surgery could consider repeat epidural steroid injection however her symptoms do seem to be more related to Tendinopathic changes as a localized to the buttock and lateral hip only with no radiation.

## 2016-11-22 NOTE — Procedures (Signed)
PROCEDURE NOTE: THERAPEUTIC EXERCISES (97110) 15 minutes spent for Therapeutic exercises as below and as referenced in the AVS. This included exercises focusing on stretching, strengthening, with significant focus on eccentric aspects.  Proper technique shown and discussed handout in great detail with ATC. All questions were discussed and answered.   Long term goals include an improvement in range of motion, strength, endurance as well as avoiding reinjury. Frequency of visits is one time as determined during today's  office visit. Frequency of exercises to be performed is as per handout.  EXERCISES REVIEWED:  Hip abductor isometrics  Hip flexor stretching

## 2016-11-22 NOTE — Assessment & Plan Note (Signed)
She has slowly improved however has significant weakness in the hip abductor's.  Prior MSK ultrasound last visit revealed small amount of bursitis with underlying glute tendinopathy and we will have her begin on nitroglycerin protocol at this time.  Therapeutic exercises were reviewed again today and modified for her to be able to perform them and we emphasized the importance of doing these on a daily basis.

## 2016-12-20 ENCOUNTER — Encounter: Payer: Self-pay | Admitting: Sports Medicine

## 2016-12-20 DIAGNOSIS — Z947 Corneal transplant status: Secondary | ICD-10-CM | POA: Diagnosis not present

## 2016-12-20 DIAGNOSIS — H01021 Squamous blepharitis right upper eyelid: Secondary | ICD-10-CM | POA: Diagnosis not present

## 2016-12-20 DIAGNOSIS — E0836 Diabetes mellitus due to underlying condition with diabetic cataract: Secondary | ICD-10-CM | POA: Diagnosis not present

## 2016-12-20 DIAGNOSIS — H1820 Unspecified corneal edema: Secondary | ICD-10-CM | POA: Diagnosis not present

## 2016-12-20 DIAGNOSIS — E08311 Diabetes mellitus due to underlying condition with unspecified diabetic retinopathy with macular edema: Secondary | ICD-10-CM | POA: Diagnosis not present

## 2016-12-20 DIAGNOSIS — H4312 Vitreous hemorrhage, left eye: Secondary | ICD-10-CM | POA: Diagnosis not present

## 2016-12-20 DIAGNOSIS — E103513 Type 1 diabetes mellitus with proliferative diabetic retinopathy with macular edema, bilateral: Secondary | ICD-10-CM | POA: Diagnosis not present

## 2016-12-20 DIAGNOSIS — H1851 Endothelial corneal dystrophy: Secondary | ICD-10-CM | POA: Diagnosis not present

## 2016-12-20 DIAGNOSIS — E103593 Type 1 diabetes mellitus with proliferative diabetic retinopathy without macular edema, bilateral: Secondary | ICD-10-CM | POA: Diagnosis not present

## 2016-12-20 DIAGNOSIS — H44001 Unspecified purulent endophthalmitis, right eye: Secondary | ICD-10-CM | POA: Diagnosis not present

## 2016-12-20 NOTE — Assessment & Plan Note (Addendum)
PROCEDURE NOTE -  ULTRASOUND GUIDEDInjection: Right Glute/Greater Troch Images were obtained and interpreted by myself, Teresa Coombs, DO  Images have been saved and stored to PACS system. Images obtained on: GE S7 Ultrasound machine  ULTRASOUND FINDINGS:  Hypoechoic change within the glute medius muscle belly consistent with a chronic strain and dysfunction  DESCRIPTION OF PROCEDURE:  The patient's clinical condition is marked by substantial pain and/or significant functional disability. Other conservative therapy has not provided relief, is contraindicated, or not appropriate. There is a reasonable likelihood that injection will significantly improve the patient's pain and/or functional impairment.  After discussing the risks, benefits and expected outcomes of the injection and all questions were reviewed and answered, the patient wished to undergo the above named procedure. Verbal consent was obtained.  The ultrasound was used to identify the target structure and adjacent neurovascular structures. The skin was then prepped in sterile fashion and the target structure was injected under direct visualization using sterile technique as below:  Right PREP: Alcohol, Ethel Chloride,  APPROACH: direct, stopcock technique, 22g 3.5in. INJECTATE: 5cc 1% lidocaine, 2cc 0.5% marcaine, 2cc 40mg /mL DepoMedrol  ASPIRATE: N/a  DRESSING: Band-Aid    Post procedural instructions including recommending icing and warning signs for infection were reviewed.  This procedure was well tolerated and there were no complications.   IMPRESSION: Succesful US Guided Injection

## 2016-12-20 NOTE — Assessment & Plan Note (Signed)
PROCEDURE NOTE: THERAPEUTIC EXERCISES (97110) 15 minutes spent for Therapeutic exercises as below and as referenced in the AVS. This included exercises focusing on stretching, strengthening, with significant focus on eccentric aspects.  Proper technique shown and discussed handout in great detail with ATC. All questions were discussed and answered.   Long term goals include an improvement in range of motion, strength, endurance as well as avoiding reinjury. Frequency of visits is one time as determined during today's  office visit. Frequency of exercises to be performed is as per handout.  EXERCISES REVIEWED:  Core strengthening exercises and generalized abdominal bracing

## 2017-01-19 ENCOUNTER — Ambulatory Visit (INDEPENDENT_AMBULATORY_CARE_PROVIDER_SITE_OTHER): Payer: Medicare Other | Admitting: Sports Medicine

## 2017-01-19 ENCOUNTER — Encounter: Payer: Self-pay | Admitting: Sports Medicine

## 2017-01-19 VITALS — BP 120/66 | HR 65 | Ht 62.0 in | Wt 149.8 lb

## 2017-01-19 DIAGNOSIS — M5136 Other intervertebral disc degeneration, lumbar region: Secondary | ICD-10-CM | POA: Diagnosis not present

## 2017-01-19 DIAGNOSIS — M7601 Gluteal tendinitis, right hip: Secondary | ICD-10-CM

## 2017-01-19 DIAGNOSIS — M25551 Pain in right hip: Secondary | ICD-10-CM

## 2017-01-19 NOTE — Progress Notes (Signed)
Pamela Warner. Pamela Warner, [name]   Pamela Warner - 72 y.o. female MRN [id number]  Date of birth: 1945/03/02  Visit Date: 01/19/2017  PCP: Burnard Bunting, MD   Referred by: Burnard Bunting, MD   Scribe for today's visit: Wendy Poet, LAT, ATC     SUBJECTIVE:  Pamela Warner is here for Follow-up (R hip and glute pain) .   Compared to the last office visit on 11/21/16, her previously described R hip and glute symptoms are improving w/ decreased pain noted.  She feels about 80-90% improved.  Notes B LBP that she describes as her lower back feeling "tired." Current symptoms are mild & are nonradiating She has been using nitro patches (1/2 to 1/3) and has been doing her own form of HEP.  She con't to use IBU and her muscle relaxer 2x/day.   She would like to know if she can return to her personal trainer at the gym.   ROS Denies night time disturbances. Denies fevers, chills, or night sweats. Denies unexplained weight loss. Denies personal history of cancer. Denies changes in bowel or bladder habits. Denies recent unreported falls. Denies new or worsening dyspnea or wheezing. Denies headaches or dizziness.  Reports numbness, tingling or weakness  In the B lower extremities from knees down. Denies dizziness or presyncopal episodes Denies lower extremity edema     HISTORY & PERTINENT PRIOR DATA:  Prior History reviewed and updated per electronic medical record.  Significant history, findings, studies and interim changes include:  reports that  has never smoked. she has never used smokeless tobacco. No results for input(s): HGBA1C, LABURIC, CREATINE in the last 8760 hours. No specialty comments available. Problem  Degeneration of Lumbar Intervertebral Disc   S/p [facility] Spine Surgery @ [facility] [place] in July 2018  MRI 11/03/16 -  1. Recurrent large right foraminal/extraforaminal disc  extrusion at L4-5 with extraforaminal right L4 nerve root encroachment. 2. Mild multifactorial spinal stenosis at L3-4 and L4-5. 3. No other evidence of nerve root encroachment.   Gluteal Tendinitis, Right Hip    OBJECTIVE:  VS:  HT:5\' 2"  (157.5 cm)   WT:149 lb 12.8 oz (67.9 kg)  BMI:27.39    BP:120/66  HR:65bpm  TEMP: ( )  RESP:99 %   PHYSICAL EXAM: Constitutional: WDWN, Non-toxic appearing. Psychiatric: Alert & appropriately interactive.  Not depressed or anxious appearing. Respiratory: No increased work of breathing.  Trachea Midline Eyes: Pupils are equal.  EOM intact without nystagmus.  No scleral icterus  NEUROVASCULAR exam: No clubbing or cyanosis appreciated No significant venous stasis changes Capillary Refill: normal, less than 2 seconds    Only mild TTP over the right greater trochanter more so over the glute medius musculature.  Improved hip abduction strength with T FL predominant still. No additional findings.   ASSESSMENT & PLAN:   1. Gluteal tendinitis, right hip   2. Right hip pain   3. Degeneration of lumbar intervertebral disc    PLAN:    Gluteal tendinitis, right hip Overall significantly improved however continue to have some symptoms.  Given the improvement but incomplete resolution we will have her continue with nitroglycerin protocol as well as the pain sternal home exercises and encouraged her to resume previously prescribed regimen.  [employer] training link provided again encouraged to be performed on an almost daily basis.  Degeneration of lumbar intervertebral disc Has been doing better and no longer having any significant radicular  symptoms   ++++++++++++++++++++++++++++++++++++++++++++ Orders & Meds: No orders of the defined types were placed in this encounter.   No orders of the defined types were placed in this encounter.   ++++++++++++++++++++++++++++++++++++++++++++ Follow-up: Return in about 8 weeks (around 03/16/2017).    Pertinent documentation may be included in additional procedure notes, imaging studies, problem based documentation and patient instructions. Please see these sections of the encounter for additional information regarding this visit. CMA/ATC served as Education administrator during this visit. History, Physical, and Plan performed by medical provider. Documentation and orders reviewed and attested to.      [name], [facility] Physician

## 2017-01-19 NOTE — Patient Instructions (Signed)

## 2017-02-07 ENCOUNTER — Encounter: Payer: Self-pay | Admitting: Sports Medicine

## 2017-02-07 ENCOUNTER — Ambulatory Visit: Payer: Self-pay

## 2017-02-07 ENCOUNTER — Ambulatory Visit (INDEPENDENT_AMBULATORY_CARE_PROVIDER_SITE_OTHER): Payer: Medicare Other | Admitting: Sports Medicine

## 2017-02-07 VITALS — BP 144/70 | HR 69

## 2017-02-07 DIAGNOSIS — E0836 Diabetes mellitus due to underlying condition with diabetic cataract: Secondary | ICD-10-CM

## 2017-02-07 DIAGNOSIS — E08311 Diabetes mellitus due to underlying condition with unspecified diabetic retinopathy with macular edema: Secondary | ICD-10-CM | POA: Diagnosis not present

## 2017-02-07 DIAGNOSIS — M7601 Gluteal tendinitis, right hip: Secondary | ICD-10-CM | POA: Diagnosis not present

## 2017-02-07 DIAGNOSIS — M5136 Other intervertebral disc degeneration, lumbar region: Secondary | ICD-10-CM

## 2017-02-07 DIAGNOSIS — E103513 Type 1 diabetes mellitus with proliferative diabetic retinopathy with macular edema, bilateral: Secondary | ICD-10-CM | POA: Diagnosis not present

## 2017-02-07 NOTE — Progress Notes (Signed)
Pamela Warner. [place], [place] at [place]  Pamela Warner - 72 y.o. female MRN [id number]  Date of birth: Jul 13, 1945  Visit Date: 02/07/2017  PCP: Burnard Bunting, MD   Referred by: Burnard Bunting, MD   Scribe for today's visit: Josepha Pigg, CMA     SUBJECTIVE:  Pamela Warner is here for Follow-up (RT hip pain)  Compared to the last office visit, her previously described symptoms are worsening. Initially the pain was minimal but she recently went on a road trip and every since then the pain has returned.  Current symptoms are severe & are radiating to RT leg, does not reach feet.  She has been taking IBU, muscle relaxer, and using Nitroglycerin patches. She had steroid injection 11/07/16 and did get relief from this.    ROS Reports night time disturbances. Denies fevers, chills, or night sweats. Denies unexplained weight loss. Denies personal history of cancer. Denies changes in bowel or bladder habits. Denies recent unreported falls. Denies new or worsening dyspnea or wheezing. Reports dizziness.  Reports numbness, tingling or weakness  In the extremities.  Denies dizziness or presyncopal episodes Denies lower extremity edema     HISTORY & PERTINENT PRIOR DATA:  Prior History reviewed and updated per electronic medical record.  Significant history, findings, studies and interim changes include:  reports that  has never smoked. she has never used smokeless tobacco. No results for input(s): HGBA1C, LABURIC, CREATINE in the last 8760 hours. No specialty comments available. Problem  Degeneration of Lumbar Intervertebral Disc   S/p Laser Spine Surgery @ [facility] in July 2018  MRI 11/03/16 -  1. Recurrent large right foraminal/extraforaminal disc extrusion at L4-5 with extraforaminal right L4 nerve root encroachment. 2. Mild multifactorial spinal stenosis at L3-4 and L4-5. 3. No  other evidence of nerve root encroachment.   Gluteal Tendinitis, Right Hip    OBJECTIVE:  VS:  HT:    WT:   BMI:     BP:(!) 144/70  HR:69bpm  TEMP: ( )  RESP:99 %   PHYSICAL EXAM: Constitutional: WDWN, Non-toxic appearing. Psychiatric: Alert & appropriately interactive.  Not depressed or anxious appearing. Respiratory: No increased work of breathing.  Trachea Midline Eyes: Pupils are equal.  EOM intact without nystagmus.  No scleral icterus  NEUROVASCULAR exam: No clubbing or cyanosis appreciated No significant venous stasis changes Capillary Refill: normal, less than 2 seconds   Right hip is overall well aligned.  She has marked tenderness over the greater trochanter.  Minimal pain over the ATFL and glute medius musculature.  Pain with resisted hip abduction.  No significant pain over the greater sciatic notch.  Negative straight leg raise. No additional findings.   ASSESSMENT & PLAN:   1. Gluteal tendinitis, right hip   2. Degeneration of lumbar intervertebral disc   3. Proliferative diabetic retinopathy of both eyes with macular edema associated with type 1 diabetes mellitus ([place])   4. Diabetic macular edema of both eyes, due to underlying condition, with cataract ([place])    PLAN:    Gluteal tendinitis, right hip Multifactorial right hip pain including spinal stenosis and glute tendinopathy with overlying bursitis that was flared up from a recent trip to the beach.  We will go ahead and reinject her hip today.  Can consider PRP down the road if any lack of improvement.  Additionally we will have her continue with nitroglycerin and emphasized the importance of continuing therapeutic exercises  and appropriate patch placement.  She can use up to half a patch in 2 separate locations   Information regarding PRP provided per AVS.   ++++++++++++++++++++++++++++++++++++++++++++ Orders & Meds: Orders Placed This Encounter  Procedures  . [place] [facility] POCT ULTRASOUND    No orders of  the defined types were placed in this encounter.   ++++++++++++++++++++++++++++++++++++++++++++ Follow-up: Return for As scheduled.   Pertinent documentation may be included in additional procedure notes, imaging studies, problem based documentation and patient instructions. Please see these sections of the encounter for additional information regarding this visit. CMA/ATC served as Education administrator during this visit. History, Physical, and Plan performed by medical provider. Documentation and orders reviewed and attested to.      [name], [facility] Physician

## 2017-02-07 NOTE — Assessment & Plan Note (Signed)
Multifactorial right hip pain including spinal stenosis and glute tendinopathy with overlying bursitis that was flared up from a recent trip to the beach.  We will go ahead and reinject her hip today.  Can consider PRP down the road if any lack of improvement.  Additionally we will have her continue with nitroglycerin and emphasized the importance of continuing therapeutic exercises and appropriate patch placement.  She can use up to half a patch in 2 separate locations   Information regarding PRP provided per AVS.

## 2017-02-07 NOTE — Assessment & Plan Note (Signed)
Has been doing better and no longer having any significant radicular symptoms

## 2017-02-07 NOTE — Assessment & Plan Note (Signed)
Overall significantly improved however continue to have some symptoms.  Given the improvement but incomplete resolution we will have her continue with nitroglycerin protocol as well as the pain sternal home exercises and encouraged her to resume previously prescribed regimen.  [employer] training link provided again encouraged to be performed on an almost daily basis.

## 2017-02-07 NOTE — Procedures (Signed)
PROCEDURE NOTE:  Ultrasound Guided: Injection: Right greater trochanteric bursa and peritendinous injection Images were obtained and interpreted by myself, Teresa Coombs, DO  Images have been saved and stored to PACS system. Images obtained on: GE S7 Ultrasound machine  ULTRASOUND FINDINGS:  Small bursal formation along the greater trochanteric bursa as well as slight interstitial tearing of the glute medius tendon that is hypertrophic but improved compared to in the past.  DESCRIPTION OF PROCEDURE:  The patient's clinical condition is marked by substantial pain and/or significant functional disability. Other conservative therapy has not provided relief, is contraindicated, or not appropriate. There is a reasonable likelihood that injection will significantly improve the patient's pain and/or functional impairment.  After discussing the risks, benefits and expected outcomes of the injection and all questions were reviewed and answered, the patient wished to undergo the above named procedure. Verbal consent was obtained.  The ultrasound was used to identify the target structure and adjacent neurovascular structures. The skin was then prepped in sterile fashion and the target structure was injected under direct visualization using sterile technique as below:  PREP: Alcohol, Ethel Chloride,   APPROACH: direct, single injection, 22g 3.5in. INJECTATE: 2cc 0.5% marcaine, 2cc 40mg /mL DepoMedrol   ASPIRATE: None   DRESSING: Band-Aid  Post procedural instructions including recommending icing and warning signs for infection were reviewed.   This procedure was well tolerated and there were no complications.    IMPRESSION: Succesful Ultrasound Guided: Injection

## 2017-02-07 NOTE — Patient Instructions (Addendum)
We are going to be setting you up to perform a PRP (platelet rich plasma) injection. The cost of this is $495 out of pocket.  We will plan to have you come back at your convenience.  You will need to be off of anti-inflammatories for approximately 2 weeks prior to the injection and continue to avoid these medications (ibuprofen, Aleeve(naproxen), Meloxicam(mobic), Voltaren(diclofenac) during the initial 6 weeks following your injection. It is okay to take Tylenol if needed.  Please go ahead and fill your tramadol prescription so you have it available for after the procedure.    You had an injection today.  Things to be aware of after injection are listed below: . You may experience no significant improvement or even a slight worsening in your symptoms during the first 24 to 48 hours.  After that we expect your symptoms to improve gradually over the next 2 weeks for the medicine to have its maximal effect.  You should continue to have improvement out to 6 weeks after your injection. . Dr. Paulla Fore recommends icing the site of the injection for 20 minutes  1-2 times the day of your injection . You may shower but no swimming, tub bath or Jacuzzi for 24 hours. . If your bandage falls off this does not need to be replaced.  It is appropriate to remove the bandage after 4 hours. . You may resume light activities as tolerated unless otherwise directed per Dr. Paulla Fore during your visit  POSSIBLE STEROID SIDE EFFECTS:  Side effects from injectable steroids tend to be less than when taken orally however you may experience some of the symptoms listed below.  If experienced these should only last for a short period of time. Change in menstrual flow  Edema (swelling)  Increased appetite Skin flushing (redness)  Skin rash/acne  Thrush (oral) Yeast vaginitis    Increased sweating  Depression Increased blood glucose levels Cramping and leg/calf  Euphoria (feeling happy)  POSSIBLE PROCEDURE SIDE EFFECTS: The side  effects of the injection are usually fairly minimal however if you may experience some of the following side effects that are usually self-limited and will is off on their own.  If you are concerned please feel free to call the office with questions:  Increased numbness or tingling  Nausea or vomiting  Swelling or bruising at the injection site   Please call our office if if you experience any of the following symptoms over the next week as these can be signs of infection:   Fever greater than 100.89F  Significant swelling at the injection site  Significant redness or drainage from the injection site  If after 2 weeks you are continuing to have worsening symptoms please call our office to discuss what the next appropriate actions should be including the potential for a return office visit or other diagnostic testing.

## 2017-02-14 ENCOUNTER — Encounter: Payer: Self-pay | Admitting: Sports Medicine

## 2017-02-14 ENCOUNTER — Ambulatory Visit (INDEPENDENT_AMBULATORY_CARE_PROVIDER_SITE_OTHER): Payer: Medicare Other | Admitting: Sports Medicine

## 2017-02-14 VITALS — BP 130/78 | HR 70 | Ht 62.0 in | Wt 146.6 lb

## 2017-02-14 DIAGNOSIS — M25551 Pain in right hip: Secondary | ICD-10-CM

## 2017-02-14 DIAGNOSIS — M5410 Radiculopathy, site unspecified: Secondary | ICD-10-CM | POA: Diagnosis not present

## 2017-02-14 DIAGNOSIS — M5136 Other intervertebral disc degeneration, lumbar region: Secondary | ICD-10-CM

## 2017-02-14 DIAGNOSIS — M7601 Gluteal tendinitis, right hip: Secondary | ICD-10-CM

## 2017-02-14 MED ORDER — NAPROXEN 500 MG PO TABS: 500.0000 mg | ORAL_TABLET | Freq: Two times a day (BID) | ORAL | 1 refills | Status: DC

## 2017-02-14 MED ORDER — GABAPENTIN 300 MG PO CAPS: ORAL_CAPSULE | ORAL | 1 refills | Status: AC

## 2017-02-14 MED ORDER — TRAMADOL HCL 50 MG PO TABS: 50.0000 mg | ORAL_TABLET | Freq: Four times a day (QID) | ORAL | 0 refills | Status: AC | PRN

## 2017-02-14 NOTE — Patient Instructions (Signed)
Please call the office on Friday, Feb. 8th to let us know how you're doing.  Otherwise, please follow-up w/ Dr. Paulla Fore in approximately 2 weeks.

## 2017-02-14 NOTE — Progress Notes (Signed)
[name]. Ivo [place], [place] at [place]  Pamela Warner - 72 y.o. female MRN [id number]  Date of birth: 11-16-1945  Visit Date: 02/14/2017  PCP: Burnard Bunting, MD   Referred by: Burnard Bunting, MD   Scribe for today's visit: Josepha Pigg, CMA     SUBJECTIVE:  Pamela Warner is here for Follow-up (gluteal tendinitis)  Compared to the last office visit, her previously described symptoms show no change, pain in now radiating into the RT leg to the RT foot.  Current symptoms are severe. Pain is worse when standing.  She has been taking Hydrocodone, prescribed by her internist, with some relief. She did not get any relief after last steroid injection 02/07/17. She is still following Nitro Protocol and taking Robaxin. She has cut back on Advil.   Pt reports that her BG has been elevated since receiving last steroid injection. Fasting BG this morning was 220, non fasting glucose has been up to 400. At night she feels like BG drops.    ROS Denies night time disturbances. Denies fevers, chills, or night sweats. Denies unexplained weight loss. Denies personal history of cancer. Denies changes in bowel or bladder habits. Denies recent unreported falls. Denies new or worsening dyspnea or wheezing. Denies headaches or dizziness.  Reports numbness, tingling or weakness  In the extremities.  Denies dizziness or presyncopal episodes Denies lower extremity edema     HISTORY & PERTINENT PRIOR DATA:  Prior History reviewed and updated per electronic medical record.  Significant/pertinent history, findings, studies include:  reports that she has never smoked. She has never used smokeless tobacco. No results for input(s): HGBA1C, LABURIC, CREATINE in the last 8760 hours. No specialty comments available. Problem  Radiculitis    OBJECTIVE:  VS:  HT:5\' 2"  (157.5 cm)   WT:146 lb 9.6 oz (66.5 kg)  BMI:26.81     BP:130/78  HR:70bpm  TEMP: ( )  RESP:97 %   PHYSICAL EXAM: Constitutional: WDWN, Non-toxic appearing. Psychiatric: Alert & appropriately interactive.  Not depressed or anxious appearing. Respiratory: No increased work of breathing.  Trachea Midline Eyes: Pupils are equal.  EOM intact without nystagmus.  No scleral icterus  VASCULAR: no edema DP & PT Pulses: normal lower extremity neuro exam: normal strength She does have pain with straight leg raise but more focally with straight leg raise.  Hip abduction strength is diminished.  Pain with palpation greater sciatic and popliteal fossa.    ASSESSMENT & PLAN:   1. Gluteal tendinitis, right hip   2. Degeneration of lumbar intervertebral disc   3. Right hip pain   4. Radiculitis     PLAN: Symptoms do most likely represent an acute flareup of her lumbar radicular symptoms.  We will plan to have her begin the below medications and if persistent issues considering repeat MRI versus referral back to neurosurgery will be discussed.       Follow-up: Return in about 2 weeks (around 02/28/2017).      Please see additional documentation for Objective, Assessment and Plan sections. Pertinent additional documentation may be included in corresponding procedure notes, imaging studies, problem based documentation and patient instructions. Please see these sections of the encounter for additional information regarding this visit.  CMA/ATC served as Education administrator during this visit. History, Physical, and Plan performed by medical provider. Documentation and orders reviewed and attested to.      [name], [facility] Physician

## 2017-02-15 DIAGNOSIS — E11319 Type 2 diabetes mellitus with unspecified diabetic retinopathy without macular edema: Secondary | ICD-10-CM | POA: Diagnosis not present

## 2017-02-15 DIAGNOSIS — E7849 Other hyperlipidemia: Secondary | ICD-10-CM | POA: Diagnosis not present

## 2017-02-15 DIAGNOSIS — I10 Essential (primary) hypertension: Secondary | ICD-10-CM | POA: Diagnosis not present

## 2017-02-19 DIAGNOSIS — Z6827 Body mass index (BMI) 27.0-27.9, adult: Secondary | ICD-10-CM | POA: Diagnosis not present

## 2017-02-19 DIAGNOSIS — Z0000 Encounter for general adult medical examination without abnormal findings: Secondary | ICD-10-CM | POA: Diagnosis not present

## 2017-02-19 DIAGNOSIS — M5416 Radiculopathy, lumbar region: Secondary | ICD-10-CM | POA: Diagnosis not present

## 2017-02-19 DIAGNOSIS — H54415A Blindness right eye category 5, normal vision left eye: Secondary | ICD-10-CM | POA: Diagnosis not present

## 2017-02-19 DIAGNOSIS — E7849 Other hyperlipidemia: Secondary | ICD-10-CM | POA: Diagnosis not present

## 2017-02-19 DIAGNOSIS — Z794 Long term (current) use of insulin: Secondary | ICD-10-CM | POA: Diagnosis not present

## 2017-02-19 DIAGNOSIS — Z1389 Encounter for screening for other disorder: Secondary | ICD-10-CM | POA: Diagnosis not present

## 2017-02-19 DIAGNOSIS — E1039 Type 1 diabetes mellitus with other diabetic ophthalmic complication: Secondary | ICD-10-CM | POA: Diagnosis not present

## 2017-02-19 DIAGNOSIS — K219 Gastro-esophageal reflux disease without esophagitis: Secondary | ICD-10-CM | POA: Diagnosis not present

## 2017-02-19 DIAGNOSIS — I10 Essential (primary) hypertension: Secondary | ICD-10-CM | POA: Diagnosis not present

## 2017-02-22 DIAGNOSIS — Z1212 Encounter for screening for malignant neoplasm of rectum: Secondary | ICD-10-CM | POA: Diagnosis not present

## 2017-02-28 ENCOUNTER — Other Ambulatory Visit: Payer: Self-pay | Admitting: Sports Medicine

## 2017-02-28 ENCOUNTER — Ambulatory Visit (INDEPENDENT_AMBULATORY_CARE_PROVIDER_SITE_OTHER): Payer: Medicare Other | Admitting: Sports Medicine

## 2017-02-28 ENCOUNTER — Encounter: Payer: Self-pay | Admitting: Sports Medicine

## 2017-02-28 VITALS — BP 110/72 | HR 66 | Ht 62.0 in | Wt 150.8 lb

## 2017-02-28 DIAGNOSIS — M25551 Pain in right hip: Secondary | ICD-10-CM | POA: Diagnosis not present

## 2017-02-28 DIAGNOSIS — Z1211 Encounter for screening for malignant neoplasm of colon: Secondary | ICD-10-CM | POA: Diagnosis not present

## 2017-02-28 DIAGNOSIS — M4306 Spondylolysis, lumbar region: Secondary | ICD-10-CM | POA: Diagnosis not present

## 2017-02-28 DIAGNOSIS — M7601 Gluteal tendinitis, right hip: Secondary | ICD-10-CM | POA: Diagnosis not present

## 2017-02-28 DIAGNOSIS — E108 Type 1 diabetes mellitus with unspecified complications: Secondary | ICD-10-CM | POA: Diagnosis not present

## 2017-02-28 DIAGNOSIS — Z1212 Encounter for screening for malignant neoplasm of rectum: Secondary | ICD-10-CM | POA: Diagnosis not present

## 2017-02-28 NOTE — Assessment & Plan Note (Signed)
A1c is slightly increased secondary to steroid injections.  She would like to avoid any further steroid injections at this time.

## 2017-02-28 NOTE — Progress Notes (Signed)
[name]. [place], [place] at [place]  Pamela Warner - 72 y.o. female MRN [id number]  Date of birth: 06-21-1945  Visit Date: 02/28/2017  PCP: Burnard Bunting, MD   Referred by: Burnard Bunting, MD   Scribe for today's visit: Wendy Poet, LAT, ATC     SUBJECTIVE:  Pamela Warner is here for Follow-up (R hip pain and glute tendinitis) .   Info from last OV on 02/14/17 Compared to the last office visit, her previously described symptoms show no change, pain in now radiating into the RT leg to the RT foot.  Current symptoms are severe. Pain is worse when standing.  She has been taking Hydrocodone, prescribed by her internist, with some relief. She did not get any relief after last steroid injection 02/07/17. She is still following Nitro Protocol and taking Robaxin. She has cut back on Advil.   Pt reports that her BG has been elevated since receiving last steroid injection. Fasting BG this morning was 220, non fasting glucose has been up to 400. At night she feels like BG drops.    Compared to the last office visit on 02/14/17, her previously described R hip pain symptoms are worsening, she recently purchased a low back massage pillow, about 3 days ago, and since using it her back pain has worsened.  Current symptoms are moderate & are radiating to RT leg.  She has been following Nitro Protocol, denies rashes or HA. She has been taking Gabapentin 300 mg BID but will increase to TID tomorrow. She has noticed increased fatigue since starting Gabapentin.   Last A1C, check by PCP about 1 weeks ago, was 7.9.    ROS Denies night time disturbances. Denies fevers, chills, or night sweats. Denies unexplained weight loss. Denies personal history of cancer. Denies changes in bowel or bladder habits. Denies recent unreported falls. Denies new or worsening dyspnea or wheezing. Denies headaches or dizziness.  Reports  numbness, tingling or weakness  In the extremities.  Denies dizziness or presyncopal episodes Denies lower extremity edema     HISTORY & PERTINENT PRIOR DATA:  Prior History reviewed and updated per electronic medical record.  Significant history, findings, studies and interim changes include:  reports that  has never smoked. she has never used smokeless tobacco. No results for input(s): HGBA1C, LABURIC, CREATINE in the last 8760 hours. No specialty comments available. Problem  Type 1 Diabetes Mellitus With Complications ([facility])  Degeneration of Lumbar Intervertebral Disc   S/p Laser Spine Surgery @ [facility] [place] in July 2018  MRI 11/03/16 -  1. Recurrent large right foraminal/extraforaminal disc extrusion at L4-5 with extraforaminal right L4 nerve root encroachment. 2. Mild multifactorial spinal stenosis at L3-4 and L4-5. 3. No other evidence of nerve root encroachment.   Gluteal Tendinitis, Right Hip    OBJECTIVE:  VS:  HT:5\' 2"  (157.5 cm)   WT:150 lb 12.8 oz (68.4 kg)  BMI:27.57    BP:110/72  HR:66bpm  TEMP: ( )  RESP:97 %   PHYSICAL EXAM: Constitutional: WDWN, Non-toxic appearing. Psychiatric: Alert & appropriately interactive.  Not depressed or anxious appearing. Respiratory: No increased work of breathing.  Trachea Midline Eyes: Pupils are equal.  EOM intact without nystagmus.  No scleral icterus  NEUROVASCULAR exam: No clubbing or cyanosis appreciated No significant venous stasis changes Capillary Refill: normal, less than 2 seconds   Patient does have a small amount of weakness with hip flexion but this is  minimal.  She is able to perform a 1 legged stand without significant limitations other than slight balance issues.  She is persistently tender over bilateral SI joints and paraspinal facets. Prior popliteal compression test was positive as resolved and is no longer painful.  Her greater sciatic notch is sore however minimally compared in the  past.   No additional findings.   ASSESSMENT & PLAN:   1. Gluteal tendinitis, right hip   2. Right hip pain   3. Lumbar spondylolysis   4. Type 1 diabetes mellitus with complications (HCC)    PLAN:    Type 1 diabetes mellitus with complications ([facility]) Pamela Warner is slightly increased secondary to steroid injections.  She would like to avoid any further steroid injections at this time.  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  Continue with therapeutic exercises while avoiding exacerbating activities and okay to continue with nitroglycerin protocol as it is giving her some relief and not causing any adverse effects.  Degeneration of lumbar intervertebral disc Multilevel degenerative changes previously well documented.  She is having more of an L3 radiculitis at this time but symptoms are improving on low-dose gabapentin.  She also reports some improvement in her diabetic neuropathy pain on gabapentin.  She has enrolled in physical therapy and will begin next week.  If any worsening symptoms can consider repeat epidural steroid injection versus repeat neurosurgical consultation but she would like to avoid this if at all possible.     ++++++++++++++++++++++++++++++++++++++++++++ Orders & Meds: No orders of the defined types were placed in this encounter.   No orders of the defined types were placed in this encounter.   ++++++++++++++++++++++++++++++++++++++++++++ Follow-up: Return in about 6 weeks (around 04/11/2017).   Pertinent documentation may be included in additional procedure notes, imaging studies, problem based documentation and patient instructions. Please see these sections of the encounter for additional information regarding this visit. CMA/ATC served as Education administrator during this visit. History, Physical, and Plan performed by medical provider. Documentation  and orders reviewed and attested to.      [name], [facility] Physician

## 2017-02-28 NOTE — Assessment & Plan Note (Signed)
Multilevel degenerative changes previously well documented.  She is having more of an L3 radiculitis at this time but symptoms are improving on low-dose gabapentin.  She also reports some improvement in her diabetic neuropathy pain on gabapentin.  She has enrolled in physical therapy and will begin next week.  If any worsening symptoms can consider repeat epidural steroid injection versus repeat neurosurgical consultation but she would like to avoid this if at all possible.

## 2017-02-28 NOTE — Assessment & Plan Note (Signed)
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  Continue with therapeutic exercises while avoiding exacerbating activities and okay to continue with nitroglycerin protocol as it is giving her some relief and not causing any adverse effects.

## 2017-03-05 DIAGNOSIS — M79604 Pain in right leg: Secondary | ICD-10-CM | POA: Diagnosis not present

## 2017-03-05 DIAGNOSIS — M545 Low back pain: Secondary | ICD-10-CM | POA: Diagnosis not present

## 2017-03-07 ENCOUNTER — Ambulatory Visit: Payer: Medicare Other | Admitting: Sports Medicine

## 2017-03-12 DIAGNOSIS — M545 Low back pain: Secondary | ICD-10-CM | POA: Diagnosis not present

## 2017-03-12 DIAGNOSIS — M79604 Pain in right leg: Secondary | ICD-10-CM | POA: Diagnosis not present

## 2017-03-15 DIAGNOSIS — M545 Low back pain: Secondary | ICD-10-CM | POA: Diagnosis not present

## 2017-03-15 DIAGNOSIS — M79604 Pain in right leg: Secondary | ICD-10-CM | POA: Diagnosis not present

## 2017-03-19 DIAGNOSIS — M79604 Pain in right leg: Secondary | ICD-10-CM | POA: Diagnosis not present

## 2017-03-19 DIAGNOSIS — M545 Low back pain: Secondary | ICD-10-CM | POA: Diagnosis not present

## 2017-03-22 DIAGNOSIS — M79604 Pain in right leg: Secondary | ICD-10-CM | POA: Diagnosis not present

## 2017-03-22 DIAGNOSIS — M545 Low back pain: Secondary | ICD-10-CM | POA: Diagnosis not present

## 2017-03-26 DIAGNOSIS — M545 Low back pain: Secondary | ICD-10-CM | POA: Diagnosis not present

## 2017-03-26 DIAGNOSIS — M79604 Pain in right leg: Secondary | ICD-10-CM | POA: Diagnosis not present

## 2017-03-29 ENCOUNTER — Telehealth: Payer: Self-pay | Admitting: Sports Medicine

## 2017-03-29 DIAGNOSIS — M79604 Pain in right leg: Secondary | ICD-10-CM | POA: Diagnosis not present

## 2017-03-29 DIAGNOSIS — M545 Low back pain: Secondary | ICD-10-CM | POA: Diagnosis not present

## 2017-03-29 NOTE — Telephone Encounter (Signed)
Copied from [place] [phone]. Topic: Quick Communication - See Telephone Encounter >> Mar 29, 2017  3:26 PM Lolita Rieger, RMA wrote: CRM for notification. See Telephone encounter for: 03/29/17.please fax copy of plan of care recert that is in pt chart to [facility] @ [phone]

## 2017-03-29 NOTE — Telephone Encounter (Signed)
See note

## 2017-03-30 NOTE — Telephone Encounter (Signed)
Re-faxed POC to [facility] per Ellie.

## 2017-04-02 DIAGNOSIS — M545 Low back pain: Secondary | ICD-10-CM | POA: Diagnosis not present

## 2017-04-02 DIAGNOSIS — M79604 Pain in right leg: Secondary | ICD-10-CM | POA: Diagnosis not present

## 2017-04-03 DIAGNOSIS — H40052 Ocular hypertension, left eye: Secondary | ICD-10-CM | POA: Diagnosis not present

## 2017-04-03 DIAGNOSIS — H401130 Primary open-angle glaucoma, bilateral, stage unspecified: Secondary | ICD-10-CM | POA: Diagnosis not present

## 2017-04-04 ENCOUNTER — Encounter: Payer: Self-pay | Admitting: Sports Medicine

## 2017-04-04 DIAGNOSIS — M5410 Radiculopathy, site unspecified: Secondary | ICD-10-CM | POA: Insufficient documentation

## 2017-04-04 NOTE — Assessment & Plan Note (Signed)
Any lack of improvement may need repeat imaging versus repeat injection.

## 2017-04-09 DIAGNOSIS — M545 Low back pain: Secondary | ICD-10-CM | POA: Diagnosis not present

## 2017-04-09 DIAGNOSIS — M79604 Pain in right leg: Secondary | ICD-10-CM | POA: Diagnosis not present

## 2017-04-11 ENCOUNTER — Encounter: Payer: Self-pay | Admitting: Sports Medicine

## 2017-04-11 ENCOUNTER — Ambulatory Visit (INDEPENDENT_AMBULATORY_CARE_PROVIDER_SITE_OTHER): Payer: Medicare Other | Admitting: Sports Medicine

## 2017-04-11 VITALS — BP 138/70 | HR 66 | Ht 62.0 in | Wt 153.0 lb

## 2017-04-11 DIAGNOSIS — M5136 Other intervertebral disc degeneration, lumbar region: Secondary | ICD-10-CM | POA: Diagnosis not present

## 2017-04-11 DIAGNOSIS — M4306 Spondylolysis, lumbar region: Secondary | ICD-10-CM

## 2017-04-11 DIAGNOSIS — M51369 Other intervertebral disc degeneration, lumbar region without mention of lumbar back pain or lower extremity pain: Secondary | ICD-10-CM

## 2017-04-11 DIAGNOSIS — M7601 Gluteal tendinitis, right hip: Secondary | ICD-10-CM

## 2017-04-11 DIAGNOSIS — E108 Type 1 diabetes mellitus with unspecified complications: Secondary | ICD-10-CM

## 2017-04-11 DIAGNOSIS — M5410 Radiculopathy, site unspecified: Secondary | ICD-10-CM | POA: Diagnosis not present

## 2017-04-11 DIAGNOSIS — M25551 Pain in right hip: Secondary | ICD-10-CM

## 2017-04-11 NOTE — Progress Notes (Signed)
Pamela Warner. [name], [place] [facility] [place]  Pamela Warner - 72 y.o. female MRN [id number]  Date of birth: [birth year]  Visit Date: 04/11/2017  PCP: Burnard Bunting, MD   Referred by: Burnard Bunting, MD  Scribe for today's visit: Wendy Poet, LAT, ATC     SUBJECTIVE:  Pamela Warner is here for Follow-up (R hip pain and glute tendinitis) .   Info from last OV on 02/14/17 Compared to the last office visit, her previously described symptoms show no change, pain in now radiating into the RT leg to the RT foot.  Current symptoms are severe. Pain is worse when standing.  She has been taking Hydrocodone, prescribed by her internist, with some relief. She did not get any relief after last steroid injection 02/07/17. She is still following Nitro Protocol and taking Robaxin. She has cut back on Advil.   Pt reports that her BG has been elevated since receiving last steroid injection. Fasting BG this morning was 220, non fasting glucose has been up to 400. At night she feels like BG drops.   02/28/2017: Compared to the last office visit on 02/14/17, her previously described R hip pain symptoms are worsening, she recently purchased a low back massage pillow, about 3 days ago, and since using it her back pain has worsened.  Current symptoms are moderate & are radiating to RT leg.  She has been following Nitro Protocol, denies rashes or HA. She has been taking Gabapentin 300 mg BID but will increase to TID tomorrow. She has noticed increased fatigue since starting Gabapentin.   Last A1C, check by PCP about 1 weeks ago, was 7.9.   04/11/2017: Compared to the last office visit on 02/28/17, her previously described R hip and glute pain symptoms are improving.  She states that her R leg is getting stronger and she can now ascend 3-4 steps in a row.  She is going to [facility] 2x/week for aquatic therapy. Current symptoms are mild & are  radiating to R post, medial leg, radiating from the pelvis to the knee. She has been using the nitroglycerin patches and using Gabapentin 300 mg and naproxen BID.  ROS Denies night time disturbances. Denies fevers, chills, or night sweats. Denies unexplained weight loss. Denies personal history of cancer. Denies changes in bowel or bladder habits. Denies recent unreported falls. Denies new or worsening dyspnea or wheezing. Denies headaches or dizziness.  Denies numbness, tingling or weakness  In the extremities.  Denies dizziness or presyncopal episodes Denies lower extremity edema    HISTORY & PERTINENT PRIOR DATA:  Prior History reviewed and updated per electronic medical record.  Significant/pertinent history, findings, studies include:  reports that she has never smoked. She has never used smokeless tobacco. No results for input(s): HGBA1C, LABURIC, CREATINE in the last 8760 hours. No specialty comments available. No problems updated.  OBJECTIVE:  VS:  HT:5\' 2"  (157.5 cm)   WT:153 lb (69.4 kg)  BMI:27.98    BP:138/70  HR:66bpm  TEMP: ( )  RESP:97 %   PHYSICAL EXAM: Constitutional: WDWN, Non-toxic appearing. Psychiatric: Alert & appropriately interactive.  Not depressed or anxious appearing. Respiratory: No increased work of breathing.  Trachea Midline Eyes: Pupils are equal.  EOM intact without nystagmus.  No scleral icterus  Vascular Exam: warm to touch no edema  lower extremity neuro exam: unremarkable  MSK Exam: Slight pain with straight leg raise on the right compared to the left.  There are flexion, plantar flexion, hip flexion, knee flexion extension strength are all within normal limits although slightly weaker than on the left.  She is able heel toe walk without significant difficulty.   ASSESSMENT & PLAN:   1. Type 1 diabetes mellitus with complications ([name])   2. Gluteal tendinitis, right hip   3. Right hip pain   4. Lumbar spondylolysis   5.  Degeneration of lumbar intervertebral disc   6. Radiculitis     PLAN: Ultimately she is doing somewhat better but continued to have persistent ongoing pain.  Medications currently are stable and reviewed today with her.  Happy to refill these at any point the next 6 months given the overall stability that she has.  If any worsening symptoms further intervention with repeat epidural steroid injection and/or repeat MRI of the lumbar spine could be considered but at this point she is happy with her progress.  And would like to minimize her side effects from the injections.  She does need to continue with home therapeutic exercises.  Follow-up: Return in about 6 months (around 10/11/2017).      Please see additional documentation for Objective, Assessment and Plan sections. Pertinent additional documentation may be included in corresponding procedure notes, imaging studies, problem based documentation and patient instructions. Please see these sections of the encounter for additional information regarding this visit.  CMA/ATC served as Education administrator during this visit. History, Physical, and Plan performed by medical provider. Documentation and orders reviewed and attested to.      [name], [place] [facility] Physician

## 2017-04-12 DIAGNOSIS — M545 Low back pain: Secondary | ICD-10-CM | POA: Diagnosis not present

## 2017-04-12 DIAGNOSIS — M79604 Pain in right leg: Secondary | ICD-10-CM | POA: Diagnosis not present

## 2017-04-17 DIAGNOSIS — M79604 Pain in right leg: Secondary | ICD-10-CM | POA: Diagnosis not present

## 2017-04-17 DIAGNOSIS — M545 Low back pain: Secondary | ICD-10-CM | POA: Diagnosis not present

## 2017-04-19 DIAGNOSIS — M545 Low back pain: Secondary | ICD-10-CM | POA: Diagnosis not present

## 2017-04-19 DIAGNOSIS — M79604 Pain in right leg: Secondary | ICD-10-CM | POA: Diagnosis not present

## 2017-04-25 DIAGNOSIS — H01021 Squamous blepharitis right upper eyelid: Secondary | ICD-10-CM | POA: Diagnosis not present

## 2017-04-25 DIAGNOSIS — E103593 Type 1 diabetes mellitus with proliferative diabetic retinopathy without macular edema, bilateral: Secondary | ICD-10-CM | POA: Diagnosis not present

## 2017-04-25 DIAGNOSIS — H401130 Primary open-angle glaucoma, bilateral, stage unspecified: Secondary | ICD-10-CM | POA: Diagnosis not present

## 2017-04-25 DIAGNOSIS — H4312 Vitreous hemorrhage, left eye: Secondary | ICD-10-CM | POA: Diagnosis not present

## 2017-04-25 DIAGNOSIS — E08311 Diabetes mellitus due to underlying condition with unspecified diabetic retinopathy with macular edema: Secondary | ICD-10-CM | POA: Diagnosis not present

## 2017-04-25 DIAGNOSIS — E0836 Diabetes mellitus due to underlying condition with diabetic cataract: Secondary | ICD-10-CM | POA: Diagnosis not present

## 2017-05-03 DIAGNOSIS — M545 Low back pain: Secondary | ICD-10-CM | POA: Diagnosis not present

## 2017-05-03 DIAGNOSIS — M79604 Pain in right leg: Secondary | ICD-10-CM | POA: Diagnosis not present

## 2017-05-05 ENCOUNTER — Other Ambulatory Visit: Payer: Self-pay | Admitting: Sports Medicine

## 2017-05-07 NOTE — Telephone Encounter (Signed)
Pt. Is calling regarding prescription.  She is leaving today after lunch and is trying to get before going.   Gone for a week

## 2017-05-15 DIAGNOSIS — M79604 Pain in right leg: Secondary | ICD-10-CM | POA: Diagnosis not present

## 2017-05-15 DIAGNOSIS — M545 Low back pain: Secondary | ICD-10-CM | POA: Diagnosis not present

## 2017-05-17 ENCOUNTER — Encounter: Payer: Self-pay | Admitting: Sports Medicine

## 2017-05-18 DIAGNOSIS — M545 Low back pain: Secondary | ICD-10-CM | POA: Diagnosis not present

## 2017-05-18 DIAGNOSIS — M79604 Pain in right leg: Secondary | ICD-10-CM | POA: Diagnosis not present

## 2017-05-22 ENCOUNTER — Other Ambulatory Visit: Payer: Self-pay | Admitting: Sports Medicine

## 2017-05-22 DIAGNOSIS — M7601 Gluteal tendinitis, right hip: Secondary | ICD-10-CM

## 2017-05-23 DIAGNOSIS — M79604 Pain in right leg: Secondary | ICD-10-CM | POA: Diagnosis not present

## 2017-05-23 DIAGNOSIS — M545 Low back pain: Secondary | ICD-10-CM | POA: Diagnosis not present

## 2017-05-25 DIAGNOSIS — M545 Low back pain: Secondary | ICD-10-CM | POA: Diagnosis not present

## 2017-05-25 DIAGNOSIS — M79604 Pain in right leg: Secondary | ICD-10-CM | POA: Diagnosis not present

## 2017-06-05 DIAGNOSIS — M79604 Pain in right leg: Secondary | ICD-10-CM | POA: Diagnosis not present

## 2017-06-05 DIAGNOSIS — M545 Low back pain: Secondary | ICD-10-CM | POA: Diagnosis not present

## 2017-06-12 DIAGNOSIS — M79604 Pain in right leg: Secondary | ICD-10-CM | POA: Diagnosis not present

## 2017-06-12 DIAGNOSIS — M545 Low back pain: Secondary | ICD-10-CM | POA: Diagnosis not present

## 2017-06-21 DIAGNOSIS — M5416 Radiculopathy, lumbar region: Secondary | ICD-10-CM | POA: Diagnosis not present

## 2017-06-21 DIAGNOSIS — E7849 Other hyperlipidemia: Secondary | ICD-10-CM | POA: Diagnosis not present

## 2017-06-21 DIAGNOSIS — K529 Noninfective gastroenteritis and colitis, unspecified: Secondary | ICD-10-CM | POA: Diagnosis not present

## 2017-06-21 DIAGNOSIS — I10 Essential (primary) hypertension: Secondary | ICD-10-CM | POA: Diagnosis not present

## 2017-06-21 DIAGNOSIS — K219 Gastro-esophageal reflux disease without esophagitis: Secondary | ICD-10-CM | POA: Diagnosis not present

## 2017-06-21 DIAGNOSIS — H54415A Blindness right eye category 5, normal vision left eye: Secondary | ICD-10-CM | POA: Diagnosis not present

## 2017-06-21 DIAGNOSIS — E11319 Type 2 diabetes mellitus with unspecified diabetic retinopathy without macular edema: Secondary | ICD-10-CM | POA: Diagnosis not present

## 2017-06-21 DIAGNOSIS — E1039 Type 1 diabetes mellitus with other diabetic ophthalmic complication: Secondary | ICD-10-CM | POA: Diagnosis not present

## 2017-06-21 DIAGNOSIS — Z794 Long term (current) use of insulin: Secondary | ICD-10-CM | POA: Diagnosis not present

## 2017-06-21 DIAGNOSIS — F418 Other specified anxiety disorders: Secondary | ICD-10-CM | POA: Diagnosis not present

## 2017-06-21 DIAGNOSIS — E663 Overweight: Secondary | ICD-10-CM | POA: Diagnosis not present

## 2017-06-21 DIAGNOSIS — Z6827 Body mass index (BMI) 27.0-27.9, adult: Secondary | ICD-10-CM | POA: Diagnosis not present

## 2017-10-11 ENCOUNTER — Ambulatory Visit: Payer: Medicare Other | Admitting: Sports Medicine

## 2017-11-21 DIAGNOSIS — F418 Other specified anxiety disorders: Secondary | ICD-10-CM | POA: Diagnosis not present

## 2017-11-21 DIAGNOSIS — H54415A Blindness right eye category 5, normal vision left eye: Secondary | ICD-10-CM | POA: Diagnosis not present

## 2017-11-21 DIAGNOSIS — M5416 Radiculopathy, lumbar region: Secondary | ICD-10-CM | POA: Diagnosis not present

## 2017-11-21 DIAGNOSIS — E1039 Type 1 diabetes mellitus with other diabetic ophthalmic complication: Secondary | ICD-10-CM | POA: Diagnosis not present

## 2017-11-21 DIAGNOSIS — E7849 Other hyperlipidemia: Secondary | ICD-10-CM | POA: Diagnosis not present

## 2017-11-21 DIAGNOSIS — I10 Essential (primary) hypertension: Secondary | ICD-10-CM | POA: Diagnosis not present

## 2017-11-21 DIAGNOSIS — K219 Gastro-esophageal reflux disease without esophagitis: Secondary | ICD-10-CM | POA: Diagnosis not present

## 2017-11-21 DIAGNOSIS — Z794 Long term (current) use of insulin: Secondary | ICD-10-CM | POA: Diagnosis not present

## 2017-11-21 DIAGNOSIS — E663 Overweight: Secondary | ICD-10-CM | POA: Diagnosis not present

## 2017-11-21 DIAGNOSIS — E10319 Type 1 diabetes mellitus with unspecified diabetic retinopathy without macular edema: Secondary | ICD-10-CM | POA: Diagnosis not present

## 2017-11-21 DIAGNOSIS — Z6827 Body mass index (BMI) 27.0-27.9, adult: Secondary | ICD-10-CM | POA: Diagnosis not present

## 2017-11-21 DIAGNOSIS — K529 Noninfective gastroenteritis and colitis, unspecified: Secondary | ICD-10-CM | POA: Diagnosis not present

## 2017-11-29 ENCOUNTER — Ambulatory Visit (INDEPENDENT_AMBULATORY_CARE_PROVIDER_SITE_OTHER): Payer: Medicare Other | Admitting: Internal Medicine

## 2017-11-29 ENCOUNTER — Encounter: Payer: Self-pay | Admitting: Internal Medicine

## 2017-11-29 VITALS — BP 146/64 | HR 66 | Ht 62.0 in | Wt 152.6 lb

## 2017-11-29 DIAGNOSIS — I10 Essential (primary) hypertension: Secondary | ICD-10-CM | POA: Diagnosis not present

## 2017-11-29 DIAGNOSIS — R072 Precordial pain: Secondary | ICD-10-CM

## 2017-11-29 DIAGNOSIS — E108 Type 1 diabetes mellitus with unspecified complications: Secondary | ICD-10-CM

## 2017-11-29 DIAGNOSIS — E785 Hyperlipidemia, unspecified: Secondary | ICD-10-CM | POA: Diagnosis not present

## 2017-11-29 DIAGNOSIS — R079 Chest pain, unspecified: Secondary | ICD-10-CM | POA: Diagnosis not present

## 2017-11-29 MED ORDER — CHLORTHALIDONE 50 MG PO TABS: 50.0000 mg | ORAL_TABLET | Freq: Every day | ORAL | 3 refills | Status: AC

## 2017-11-29 MED ORDER — METOPROLOL TARTRATE 100 MG PO TABS: ORAL_TABLET | ORAL | 0 refills | Status: AC

## 2017-11-29 NOTE — Patient Instructions (Signed)
Medication Instructions:  Your physician has recommended you make the following change in your medication:  1) INCREASE Chlorthalidone to 50mg  daily. An Rx has been sent to your pharmacy  A onetime dose of Metoprolol 100mg  to be taken 2 hours prior to your Coronary CTA has been sent to your pharmacy  If you need a refill on your cardiac medications before your next appointment, please call your pharmacy.   Lab work: Your physician recommends that you return for lab work 1-2 days prior to your CTA  If you have labs (blood work) drawn today and your tests are completely normal, you will receive your results only by: Marland Kitchen MyChart Message (if you have MyChart) OR . A paper copy in the mail If you have any lab test that is abnormal or we need to change your treatment, we will call you to review the results.  Testing/Procedures: Your physician has requested that you have cardiac CT. Cardiac computed tomography (CT) is a painless test that uses an x-ray machine to take clear, detailed pictures of your heart. For further information please visit HugeFiesta.tn. Please follow instruction sheet as given.     Follow-Up: At [facility], you and your health needs are our priority.  As part of our continuing mission to provide you with exceptional heart care, we have created designated Provider Care Teams.  These Care Teams include your primary Cardiologist (physician) and Advanced Practice Providers (APPs -  Physician Assistants and Nurse Practitioners) who all work together to provide you with the care you need, when you need it. You will need a follow up appointment in 3 months.   You may see  Dr. Margaretann Loveless or one of the following Advanced Practice Providers on your designated Care Team:   Rosaria Ferries, PA-C . Jory Sims, DNP, ANP  Any Other Special Instructions Will Be Listed Below (If Applicable).  Please call the office in 2 weeks to give [place] an update on your BP  Please arrive at  the [facility] main entrance of [facility] at TBD AM (30-45 minutes prior to test start time)  [facility] [place], [place] [address] [phone]  Proceed to the [facility] Radiology Department (First Floor).  Please follow these instructions carefully (unless otherwise directed):   On the Night Before the Test: . Be sure to Drink plenty of water. . Do not consume any caffeinated/decaffeinated beverages or chocolate 12 hours prior to your test. . Do not take any antihistamines 12 hours prior to your test.    On the Day of the Test: . Drink plenty of water. Do not drink any water within one hour of the test. . Do not eat any food 4 hours prior to the test. . You may take your regular medications prior to the test.  . Take metoprolol (Lopressor) two hours prior to test.       After the Test: . Drink plenty of water. . After receiving IV contrast, you may experience a mild flushed feeling. This is normal. . On occasion, you may experience a mild rash up to 24 hours after the test. This is not dangerous. If this occurs, you can take Benadryl 25 mg and increase your fluid intake. . If you experience trouble breathing, this can be serious. If it is severe call 911 IMMEDIATELY. If it is mild, please call our office. . If you take any of these medications: Glipizide/Metformin, Avandament, Glucavance, please do not take 48 hours after  completing test.

## 2017-11-29 NOTE — Progress Notes (Signed)
Cardiology Office Note:    Date:  11/29/2017   ID:  Pamela Warner, DOB 1945-08-30, MRN [id number]  PCP:  Burnard Bunting, MD  Cardiologist:  No primary care provider on file.  Electrophysiologist:  None   Referring MD: Burnard Bunting, MD   Chest discomfort and left jaw pain  History of Present Illness:    Pamela Warner is a 72 y.o. female with a hx of DM1 for 50 years, essential hypertension, and hyperlipidemia who presents for evaluation of chest discomfort.   There is no predictable pattern to her chest discomfort, it can occur at rest or with exertion, is brief, and it is unclear if it is cardiac or may be related to GERD. It can occur several times a day or several times a week. Due to back discomfort, she is no longer able to walk for exercise like she has in previous years. She has mild exertional dyspnea, but does not describe limiting shortness of breath.  No significant dyspnea at rest, palpitations, PND, orthopnea, or leg swelling. Denies syncope or presyncope. Occasional snoring and has not be evaluated for sleep apnea.  Occasional dizziness or lightheadedness, she has been on lisinopril, stopped for cough, and currently on amlodipine with dizziness if taken daily. She is also on chlorthalidone. She has mildly elevated blood pressure, not at goal considering history of diabetes. On simvastatin 20 mg daily.  She had an evaluation in 2013 with my colleague Dr. Aundra Dubin for chest pain and hypertension. Chlorthalidone started and uptitrated to current dose. Negative stress Myoview but positive ECG. Aggressive medical management recommended, discussion about coronary CTA had. Normal echocardiogram at that time.   Past Medical History:  Diagnosis Date  . Diabetes mellitus    type 1  . Essential hypertension   . Hyperlipidemia     Past Surgical History:  Procedure Laterality Date  . APPENDECTOMY    . BACK SURGERY    . CESAREAN SECTION     x2  . OTHER SURGICAL  HISTORY  2004   cardiolite    Current Medications: Current Meds  Medication Sig  . amLODipine (NORVASC) 2.5 MG tablet Take 2.5 mg by mouth daily.  Marland Kitchen escitalopram (LEXAPRO) 10 MG tablet Take 10 mg by mouth daily.  Marland Kitchen estrogens, conjugated, (PREMARIN) 0.625 MG tablet Take 0.625 mg by mouth daily. Take daily for 21 days then do not take for 7 days.  Marland Kitchen gabapentin (NEURONTIN) 300 MG capsule Start with 1 tab po qhs X 1 week, then increase to 1 tab po bid X 1 week then 1 tab po tid prn  . GLUCAGON EMERGENCY 1 MG injection as directed.  Marland Kitchen HYDROcodone-acetaminophen (NORCO/VICODIN) 5-325 MG tablet Take by mouth.  . insulin glargine (LANTUS) 100 UNIT/ML injection Inject into the skin at bedtime. 12-14 units in the morning  . insulin NPH (HUMULIN N,NOVOLIN N) 100 UNIT/ML injection Inject into the skin. Sliding scale  . insulin regular (HUMULIN R) 100 units/mL injection Inject into the skin 3 (three) times daily before meals. 8-9 unints at night 1 u it all depends  . methocarbamol (ROBAXIN) 750 MG tablet Take 750 mg by mouth 4 (four) times daily as needed.  . Multiple Vitamin (MULTI-VITAMIN PO) Take by mouth daily.  . naproxen (NAPROSYN) 500 MG tablet TAKE ONE TABLET TWICE DAILY WITH A MEAL  . nitroGLYCERIN (NITRODUR - DOSED IN MG/24 HR) 0.2 mg/hr patch PLACE 1/4 TO 1/2 PATCH OVER AFFECTED REGION- REMOVE AND REPLACE ONCE DAILY SLIGHTLY ALTER SKIN PLACEMENT DAILY  .  omeprazole (PRILOSEC) 20 MG capsule Take 20 mg by mouth daily.  . simvastatin (ZOCOR) 20 MG tablet Take 20 mg by mouth every evening.  . timolol (TIMOPTIC) 0.5 % ophthalmic solution Place 1 drop into the left eye 2 (two) times daily.  . traMADol (ULTRAM) 50 MG tablet Take 1 tablet (50 mg total) by mouth every 6 (six) hours as needed for moderate pain.     Allergies:   Acetaminophen   Social History   Socioeconomic History  . Marital status: Single    Spouse name: Not on file  . Number of children: Not on file  . Years of education: Not  on file  . Highest education level: Not on file  Occupational History  . Not on file  Social Needs  . Financial resource strain: Not on file  . Food insecurity:    Worry: Not on file    Inability: Not on file  . Transportation needs:    Medical: Not on file    Non-medical: Not on file  Tobacco Use  . Smoking status: Never Smoker  . Smokeless tobacco: Never Used  Substance and Sexual Activity  . Alcohol use: Yes  . Drug use: No  . Sexual activity: Not on file  Lifestyle  . Physical activity:    Days per week: Not on file    Minutes per session: Not on file  . Stress: Not on file  Relationships  . Social connections:    Talks on phone: Not on file    Gets together: Not on file    Attends religious service: Not on file    Active member of club or organization: Not on file    Attends meetings of clubs or organizations: Not on file    Relationship status: Not on file  Other Topics Concern  . Not on file  Social History Narrative  . Not on file     Family History: The patient's family history includes Clotting disorder in her sister; Diabetes in her sister; Heart attack in her maternal grandfather; Heart attack (age of onset: 43) in her father; Heart disease in her mother; Hyperlipidemia in her mother; Hypertension in her mother; Mesothelioma in her father; Stroke (age of onset: 69) in her mother.  ROS:   Please see the history of present illness.    All other systems reviewed and are negative.  EKGs/Labs/Other Studies Reviewed:    The following studies were reviewed today:  EKG:  EKG is ordered today.  The ekg ordered today demonstrates NSR, ventricular rate 66, borderline LVH criteria.  Recent Labs: No results found for requested labs within last 8760 hours.  Recent Lipid Panel    Component Value Date/Time   CHOL 161 03/14/2011 0851   TRIG 115.0 03/14/2011 0851   HDL 65.50 03/14/2011 0851   CHOLHDL 2 03/14/2011 0851   VLDL 23.0 03/14/2011 0851   LDLCALC 73  03/14/2011 0851    Physical Exam:    VS:  BP (!) 146/64   Pulse 66   Ht 5\' 2"  (1.575 m)   Wt 152 lb 9.6 oz (69.2 kg)   BMI 27.91 kg/m     Wt Readings from Last 3 Encounters:  11/29/17 152 lb 9.6 oz (69.2 kg)  04/11/17 153 lb (69.4 kg)  02/28/17 150 lb 12.8 oz (68.4 kg)     Constitutional: No acute distress Eyes: pupils equally round and reactive to light, sclera non-icteric, normal conjunctiva and lids ENMT: normal dentition, moist mucous membranes Cardiovascular: regular  rhythm, normal rate, no murmurs. S1 and S2 normal. Radial pulses normal bilaterally. No jugular venous distention.  Respiratory: clear to auscultation bilaterally GI : normal bowel sounds, soft and nontender. No distention.   MSK: extremities warm, well perfused. No edema.  NEURO: grossly nonfocal exam, moves all extremities. PSYCH: alert and oriented x 3, normal mood and affect.  Skin: warm and dry  ASSESSMENT:    1. Precordial chest pain   2. Chest pain, unspecified type   3. Essential hypertension   4. Type 1 diabetes mellitus with complications (HCC)   5. Hyperlipidemia, unspecified hyperlipidemia type    PLAN:    She is not particularly bothered by chest pain, but family history and DM1 for 50 years are concerning risk factors. We will complete a CTA coronary artery study as previous discussed by Dr. Aundra Dubin. This will allow [place] to assess burden of disease and functional significance.   For hypertension, we should try to uptitrate her chlorthalidone as she tolerates this well. We will increase to 50 mg daily, and if she does not tolerate this we will discuss alternate therapies with goal BP <140/90. We will call the patient in 2 weeks to evaluate her medication therapy and daily blood pressures. I will see her back in 3 months.   Medication Adjustments/Labs and Tests Ordered: Current medicines are reviewed at length with the patient today.  Concerns regarding medicines are outlined above.  Orders  Placed This Encounter  Procedures  . CT CORONARY MORPH W/CTA COR W/SCORE W/CA W/CM &/OR WO/CM  . CT CORONARY FRACTIONAL FLOW RESERVE DATA PREP  . CT CORONARY FRACTIONAL FLOW RESERVE FLUID ANALYSIS  . Basic metabolic panel  . EKG 12-Lead   Meds ordered this encounter  Medications  . chlorthalidone (HYGROTON) 50 MG tablet    Sig: Take 1 tablet (50 mg total) by mouth daily.    Dispense:  90 tablet    Refill:  3  . metoprolol tartrate (LOPRESSOR) 100 MG tablet    Sig: Take 1 tablet (100mg ) 2 hours prior to your Coronary CT    Dispense:  1 tablet    Refill:  0    Patient Instructions  Medication Instructions:  Your physician has recommended you make the following change in your medication:  1) INCREASE Chlorthalidone to 50mg  daily. An Rx has been sent to your pharmacy  A onetime dose of Metoprolol 100mg  to be taken 2 hours prior to your Coronary CTA has been sent to your pharmacy  If you need a refill on your cardiac medications before your next appointment, please call your pharmacy.   Lab work: Your physician recommends that you return for lab work 1-2 days prior to your CTA  If you have labs (blood work) drawn today and your tests are completely normal, you will receive your results only by: Marland Kitchen MyChart Message (if you have MyChart) OR . A paper copy in the mail If you have any lab test that is abnormal or we need to change your treatment, we will call you to review the results.  Testing/Procedures: Your physician has requested that you have cardiac CT. Cardiac computed tomography (CT) is a painless test that uses an x-ray machine to take clear, detailed pictures of your heart. For further information please visit HugeFiesta.tn. Please follow instruction sheet as given.     Follow-Up: At [facility], you and your health needs are our priority.  As part of our continuing mission to provide you with exceptional heart care, we  have created designated Provider Care Teams.   These Care Teams include your primary Cardiologist (physician) and Advanced Practice Providers (APPs -  Physician Assistants and Nurse Practitioners) who all work together to provide you with the care you need, when you need it. You will need a follow up appointment in 3 months.   You may see  Dr. Margaretann Loveless or one of the following Advanced Practice Providers on your designated Care Team:   Rosaria Ferries, PA-C . Jory Sims, DNP, ANP  Any Other Special Instructions Will Be Listed Below (If Applicable).  Please call the office in 2 weeks to give [place] an update on your BP  Please arrive at the [facility] main entrance of [facility] at TBD AM (30-45 minutes prior to test start time)  [facility] [place], [place] [address] [phone]  Proceed to the [facility] Radiology Department (First Floor).  Please follow these instructions carefully (unless otherwise directed):   On the Night Before the Test: . Be sure to Drink plenty of water. . Do not consume any caffeinated/decaffeinated beverages or chocolate 12 hours prior to your test. . Do not take any antihistamines 12 hours prior to your test.    On the Day of the Test: . Drink plenty of water. Do not drink any water within one hour of the test. . Do not eat any food 4 hours prior to the test. . You may take your regular medications prior to the test.  . Take metoprolol (Lopressor) two hours prior to test.       After the Test: . Drink plenty of water. . After receiving IV contrast, you may experience a mild flushed feeling. This is normal. . On occasion, you may experience a mild rash up to 24 hours after the test. This is not dangerous. If this occurs, you can take Benadryl 25 mg and increase your fluid intake. . If you experience trouble breathing, this can be serious. If it is severe call 911 IMMEDIATELY. If it is mild, please call our office. . If you take any of these medications:  Glipizide/Metformin, Avandament, Glucavance, please do not take 48 hours after completing test.       Signed, Elouise Munroe, MD  11/29/2017 6:12 PM    [facility]

## 2017-12-13 ENCOUNTER — Telehealth: Payer: Self-pay

## 2017-12-13 NOTE — Telephone Encounter (Signed)
  Pt was seen on 11/29/17 by Dr.Acharya and Chlorthalidone was increased to 50mg  daily. F/u call placed to see how pt is tolerating the medication increase and if her BP has been better controlled. lmtcb.

## 2018-01-11 DIAGNOSIS — Z85828 Personal history of other malignant neoplasm of skin: Secondary | ICD-10-CM | POA: Diagnosis not present

## 2018-01-11 DIAGNOSIS — D2272 Melanocytic nevi of left lower limb, including hip: Secondary | ICD-10-CM | POA: Diagnosis not present

## 2018-01-11 DIAGNOSIS — D1801 Hemangioma of skin and subcutaneous tissue: Secondary | ICD-10-CM | POA: Diagnosis not present

## 2018-01-11 DIAGNOSIS — D225 Melanocytic nevi of trunk: Secondary | ICD-10-CM | POA: Diagnosis not present

## 2018-01-11 DIAGNOSIS — L821 Other seborrheic keratosis: Secondary | ICD-10-CM | POA: Diagnosis not present

## 2018-01-21 ENCOUNTER — Telehealth (HOSPITAL_COMMUNITY): Payer: Self-pay | Admitting: Emergency Medicine

## 2018-01-21 NOTE — Telephone Encounter (Signed)
Left message on voicemail with name and callback number Krystopher Kuenzel RN Navigator Cardiac Imaging [phone] 

## 2018-01-21 NOTE — Telephone Encounter (Signed)
Pt returning phone call >> Reaching out to patient to offer assistance regarding upcoming cardiac imaging study; pt verbalizes understanding of appt date/time, parking situation and where to check in, pre-test NPO status and medications ordered, and verified current allergies; name and call back number provided for further questions should they arise  Patient states she was told we could draw her blood before the test.   Marchia Bond RN Navigator Cardiac Imaging [phone]

## 2018-01-22 ENCOUNTER — Ambulatory Visit (HOSPITAL_COMMUNITY)
Admission: RE | Admit: 2018-01-22 | Discharge: 2018-01-22 | Disposition: A | Discharge status: Home / Self Care | Payer: Medicare Other | Source: Ambulatory Visit | Attending: Internal Medicine | Admitting: Internal Medicine

## 2018-01-22 ENCOUNTER — Encounter (HOSPITAL_COMMUNITY): Payer: Self-pay

## 2018-01-22 DIAGNOSIS — R072 Precordial pain: Secondary | ICD-10-CM | POA: Insufficient documentation

## 2018-01-22 LAB — POCT I-STAT, CHEM 8
BUN: 18 mg/dL (ref 8–23)
Calcium, Ion: 1.16 mmol/L (ref 1.15–1.40)
Chloride: 94 mmol/L — ABNORMAL LOW (ref 98–111)
Creatinine, Ser: 0.8 mg/dL (ref 0.44–1.00)
Glucose, Bld: 323 mg/dL — ABNORMAL HIGH (ref 70–99)
HCT: 39 % (ref 36.0–46.0)
HEMOGLOBIN: 13.3 g/dL (ref 12.0–15.0)
Potassium: 3.9 mmol/L (ref 3.5–5.1)
Sodium: 131 mmol/L — ABNORMAL LOW (ref 135–145)
TCO2: 28 mmol/L (ref 22–32)

## 2018-01-22 MED ORDER — NITROGLYCERIN 0.4 MG SL SUBL
0.8000 mg | SUBLINGUAL_TABLET | Freq: Once | SUBLINGUAL | Status: AC
  Administered 2018-01-22: 0.8 mg via SUBLINGUAL
  Filled 2018-01-22: qty 25

## 2018-01-22 MED ORDER — IOPAMIDOL (ISOVUE-370) INJECTION 76%
100.0000 mL | Freq: Once | INTRAVENOUS | Status: AC | PRN
  Administered 2018-01-22: 100 mL via INTRAVENOUS

## 2018-01-22 MED ORDER — NITROGLYCERIN 0.4 MG SL SUBL
SUBLINGUAL_TABLET | SUBLINGUAL | Status: AC
  Filled 2018-01-22: qty 2

## 2018-01-23 ENCOUNTER — Telehealth: Payer: Self-pay

## 2018-01-23 DIAGNOSIS — K769 Liver disease, unspecified: Secondary | ICD-10-CM

## 2018-01-23 NOTE — Telephone Encounter (Signed)
Called to give pt Cor CT results and Dr.Acharya's. lmtcb.

## 2018-01-23 NOTE — Telephone Encounter (Signed)
-----   Message from Elouise Munroe, MD sent at 01/22/2018  5:15 PM EST ----- Mild coronary artery disease.  Radiology has recommended right upper quadrant ultrasound for liver lesion that is likely a hemangioma but needs to be evaluated further, please order this and call the patient.

## 2018-01-24 NOTE — Telephone Encounter (Signed)
-----   Message from Elouise Munroe, MD sent at 01/22/2018  5:15 PM EST ----- Mild coronary artery disease.  Radiology has recommended right upper quadrant ultrasound for liver lesion that is likely a hemangioma but needs to be evaluated further, please order this and call the patient.

## 2018-01-24 NOTE — Telephone Encounter (Signed)
Pt aware of Cor CT results, she is agreeable with the Radiologist recommendation for f/u hepatic ultrasound. Adv pt that I will pace the order with [facility] and they will call her directly to schedule.  Pt has an appt in Feb 2020 with Dr.Acharya. adv pt to keep the appt to discuss plan of care and health management.

## 2018-01-24 NOTE — Telephone Encounter (Signed)
Pamela Warner is returning your call regarding results.

## 2018-01-25 ENCOUNTER — Other Ambulatory Visit: Payer: Self-pay

## 2018-01-25 DIAGNOSIS — K769 Liver disease, unspecified: Secondary | ICD-10-CM

## 2018-01-25 NOTE — Progress Notes (Signed)
ua

## 2018-01-30 ENCOUNTER — Inpatient Hospital Stay: Admission: RE | Admit: 2018-01-30 | Payer: Medicare Other | Source: Ambulatory Visit

## 2018-01-30 ENCOUNTER — Other Ambulatory Visit: Payer: Medicare Other

## 2018-01-30 DIAGNOSIS — E08311 Diabetes mellitus due to underlying condition with unspecified diabetic retinopathy with macular edema: Secondary | ICD-10-CM | POA: Diagnosis not present

## 2018-01-30 DIAGNOSIS — H4312 Vitreous hemorrhage, left eye: Secondary | ICD-10-CM | POA: Diagnosis not present

## 2018-01-30 DIAGNOSIS — H1851 Endothelial corneal dystrophy: Secondary | ICD-10-CM | POA: Diagnosis not present

## 2018-01-30 DIAGNOSIS — H401130 Primary open-angle glaucoma, bilateral, stage unspecified: Secondary | ICD-10-CM | POA: Diagnosis not present

## 2018-01-30 DIAGNOSIS — H44001 Unspecified purulent endophthalmitis, right eye: Secondary | ICD-10-CM | POA: Diagnosis not present

## 2018-01-30 DIAGNOSIS — E103513 Type 1 diabetes mellitus with proliferative diabetic retinopathy with macular edema, bilateral: Secondary | ICD-10-CM | POA: Diagnosis not present

## 2018-01-30 DIAGNOSIS — E103593 Type 1 diabetes mellitus with proliferative diabetic retinopathy without macular edema, bilateral: Secondary | ICD-10-CM | POA: Diagnosis not present

## 2018-01-30 DIAGNOSIS — H1820 Unspecified corneal edema: Secondary | ICD-10-CM | POA: Diagnosis not present

## 2018-01-30 DIAGNOSIS — H01021 Squamous blepharitis right upper eyelid: Secondary | ICD-10-CM | POA: Diagnosis not present

## 2018-01-30 DIAGNOSIS — E0836 Diabetes mellitus due to underlying condition with diabetic cataract: Secondary | ICD-10-CM | POA: Diagnosis not present

## 2018-02-05 ENCOUNTER — Other Ambulatory Visit: Payer: Medicare Other

## 2018-02-05 ENCOUNTER — Ambulatory Visit
Admission: RE | Admit: 2018-02-05 | Discharge: 2018-02-05 | Disposition: A | Discharge status: Home / Self Care | Payer: Medicare Other | Source: Ambulatory Visit | Attending: Internal Medicine | Admitting: Internal Medicine

## 2018-02-05 DIAGNOSIS — D1801 Hemangioma of skin and subcutaneous tissue: Secondary | ICD-10-CM | POA: Diagnosis not present

## 2018-02-05 DIAGNOSIS — K769 Liver disease, unspecified: Secondary | ICD-10-CM

## 2018-02-15 DIAGNOSIS — N644 Mastodynia: Secondary | ICD-10-CM | POA: Diagnosis not present

## 2018-03-04 DIAGNOSIS — E7849 Other hyperlipidemia: Secondary | ICD-10-CM | POA: Diagnosis not present

## 2018-03-04 DIAGNOSIS — R82998 Other abnormal findings in urine: Secondary | ICD-10-CM | POA: Diagnosis not present

## 2018-03-04 DIAGNOSIS — I10 Essential (primary) hypertension: Secondary | ICD-10-CM | POA: Diagnosis not present

## 2018-03-04 DIAGNOSIS — E1039 Type 1 diabetes mellitus with other diabetic ophthalmic complication: Secondary | ICD-10-CM | POA: Diagnosis not present

## 2018-03-05 ENCOUNTER — Ambulatory Visit: Payer: Medicare Other | Admitting: Internal Medicine

## 2018-03-05 DIAGNOSIS — H40052 Ocular hypertension, left eye: Secondary | ICD-10-CM | POA: Diagnosis not present

## 2018-03-06 ENCOUNTER — Ambulatory Visit: Payer: Medicare Other | Admitting: Internal Medicine

## 2018-03-11 DIAGNOSIS — M5416 Radiculopathy, lumbar region: Secondary | ICD-10-CM | POA: Diagnosis not present

## 2018-03-11 DIAGNOSIS — R0789 Other chest pain: Secondary | ICD-10-CM | POA: Diagnosis not present

## 2018-03-11 DIAGNOSIS — K529 Noninfective gastroenteritis and colitis, unspecified: Secondary | ICD-10-CM | POA: Diagnosis not present

## 2018-03-11 DIAGNOSIS — E7849 Other hyperlipidemia: Secondary | ICD-10-CM | POA: Diagnosis not present

## 2018-03-11 DIAGNOSIS — H54415A Blindness right eye category 5, normal vision left eye: Secondary | ICD-10-CM | POA: Diagnosis not present

## 2018-03-11 DIAGNOSIS — K219 Gastro-esophageal reflux disease without esophagitis: Secondary | ICD-10-CM | POA: Diagnosis not present

## 2018-03-11 DIAGNOSIS — I10 Essential (primary) hypertension: Secondary | ICD-10-CM | POA: Diagnosis not present

## 2018-03-11 DIAGNOSIS — F418 Other specified anxiety disorders: Secondary | ICD-10-CM | POA: Diagnosis not present

## 2018-03-11 DIAGNOSIS — E1039 Type 1 diabetes mellitus with other diabetic ophthalmic complication: Secondary | ICD-10-CM | POA: Diagnosis not present

## 2018-03-11 DIAGNOSIS — Z1331 Encounter for screening for depression: Secondary | ICD-10-CM | POA: Diagnosis not present

## 2018-03-11 DIAGNOSIS — Z0000 Encounter for general adult medical examination without abnormal findings: Secondary | ICD-10-CM | POA: Diagnosis not present

## 2018-03-11 DIAGNOSIS — Z794 Long term (current) use of insulin: Secondary | ICD-10-CM | POA: Diagnosis not present

## 2018-03-26 ENCOUNTER — Ambulatory Visit: Payer: Medicare Other | Admitting: Internal Medicine

## 2018-07-03 DIAGNOSIS — H4312 Vitreous hemorrhage, left eye: Secondary | ICD-10-CM | POA: Diagnosis not present

## 2018-07-03 DIAGNOSIS — H44001 Unspecified purulent endophthalmitis, right eye: Secondary | ICD-10-CM | POA: Diagnosis not present

## 2018-07-03 DIAGNOSIS — E103593 Type 1 diabetes mellitus with proliferative diabetic retinopathy without macular edema, bilateral: Secondary | ICD-10-CM | POA: Diagnosis not present

## 2018-07-03 DIAGNOSIS — H01021 Squamous blepharitis right upper eyelid: Secondary | ICD-10-CM | POA: Diagnosis not present

## 2018-07-03 DIAGNOSIS — H40052 Ocular hypertension, left eye: Secondary | ICD-10-CM | POA: Diagnosis not present

## 2018-09-02 DIAGNOSIS — R0789 Other chest pain: Secondary | ICD-10-CM | POA: Diagnosis not present

## 2018-09-02 DIAGNOSIS — E1039 Type 1 diabetes mellitus with other diabetic ophthalmic complication: Secondary | ICD-10-CM | POA: Diagnosis not present

## 2018-09-02 DIAGNOSIS — M5416 Radiculopathy, lumbar region: Secondary | ICD-10-CM | POA: Diagnosis not present

## 2018-09-02 DIAGNOSIS — H54415A Blindness right eye category 5, normal vision left eye: Secondary | ICD-10-CM | POA: Diagnosis not present

## 2018-09-02 DIAGNOSIS — F418 Other specified anxiety disorders: Secondary | ICD-10-CM | POA: Diagnosis not present

## 2018-09-02 DIAGNOSIS — K529 Noninfective gastroenteritis and colitis, unspecified: Secondary | ICD-10-CM | POA: Diagnosis not present

## 2018-09-02 DIAGNOSIS — E663 Overweight: Secondary | ICD-10-CM | POA: Diagnosis not present

## 2018-09-02 DIAGNOSIS — I10 Essential (primary) hypertension: Secondary | ICD-10-CM | POA: Diagnosis not present

## 2018-09-02 DIAGNOSIS — Z794 Long term (current) use of insulin: Secondary | ICD-10-CM | POA: Diagnosis not present

## 2018-09-02 DIAGNOSIS — K219 Gastro-esophageal reflux disease without esophagitis: Secondary | ICD-10-CM | POA: Diagnosis not present

## 2018-09-02 DIAGNOSIS — E785 Hyperlipidemia, unspecified: Secondary | ICD-10-CM | POA: Diagnosis not present

## 2018-09-02 DIAGNOSIS — E10319 Type 1 diabetes mellitus with unspecified diabetic retinopathy without macular edema: Secondary | ICD-10-CM | POA: Diagnosis not present

## 2018-11-15 ENCOUNTER — Other Ambulatory Visit: Payer: Self-pay

## 2018-11-15 DIAGNOSIS — Z20822 Contact with and (suspected) exposure to covid-19: Secondary | ICD-10-CM

## 2018-11-16 LAB — NOVEL CORONAVIRUS, NAA: SARS-CoV-2, NAA: NOT DETECTED

## 2018-11-20 ENCOUNTER — Telehealth: Payer: Self-pay | Admitting: General Practice

## 2018-11-20 NOTE — Telephone Encounter (Signed)
Negative COVID results given. Patient results "NOT Detected." Caller expressed understanding. ° °

## 2019-01-14 DIAGNOSIS — S8002XA Contusion of left knee, initial encounter: Secondary | ICD-10-CM | POA: Diagnosis not present

## 2019-01-14 DIAGNOSIS — M25562 Pain in left knee: Secondary | ICD-10-CM | POA: Diagnosis not present

## 2019-02-11 DIAGNOSIS — M9905 Segmental and somatic dysfunction of pelvic region: Secondary | ICD-10-CM | POA: Diagnosis not present

## 2019-02-11 DIAGNOSIS — M5416 Radiculopathy, lumbar region: Secondary | ICD-10-CM | POA: Diagnosis not present

## 2019-02-11 DIAGNOSIS — M65342 Trigger finger, left ring finger: Secondary | ICD-10-CM | POA: Diagnosis not present

## 2019-02-11 DIAGNOSIS — M9903 Segmental and somatic dysfunction of lumbar region: Secondary | ICD-10-CM | POA: Diagnosis not present

## 2019-02-22 ENCOUNTER — Ambulatory Visit: Payer: Medicare Other | Attending: Internal Medicine

## 2019-02-22 DIAGNOSIS — Z23 Encounter for immunization: Secondary | ICD-10-CM | POA: Insufficient documentation

## 2019-02-22 NOTE — Progress Notes (Signed)
   Covid-19 Vaccination Clinic  Name:  SHAIANN HEINISCH    MRN: [id number] DOB: 19-Apr-1945  02/22/2019  Ms. Boynes was observed post Covid-19 immunization for 15 minutes without incidence. She was provided with Vaccine Information Sheet and instruction to access the V-Safe system.   Ms. Pretti was instructed to call 911 with any severe reactions post vaccine: Marland Kitchen Difficulty breathing  . Swelling of your face and throat  . A fast heartbeat  . A bad rash all over your body  . Dizziness and weakness    Immunizations Administered    Name Date Dose VIS Date Route   Pfizer COVID-19 Vaccine 02/22/2019  2:30 PM 0.3 mL 12/20/2018 Intramuscular   Manufacturer: [place]   Lot: [id number]   [place]: [id number]

## 2019-03-06 DIAGNOSIS — E10319 Type 1 diabetes mellitus with unspecified diabetic retinopathy without macular edema: Secondary | ICD-10-CM | POA: Diagnosis not present

## 2019-03-06 DIAGNOSIS — E7849 Other hyperlipidemia: Secondary | ICD-10-CM | POA: Diagnosis not present

## 2019-03-13 DIAGNOSIS — I10 Essential (primary) hypertension: Secondary | ICD-10-CM | POA: Diagnosis not present

## 2019-03-13 DIAGNOSIS — E663 Overweight: Secondary | ICD-10-CM | POA: Diagnosis not present

## 2019-03-13 DIAGNOSIS — M5416 Radiculopathy, lumbar region: Secondary | ICD-10-CM | POA: Diagnosis not present

## 2019-03-13 DIAGNOSIS — E10319 Type 1 diabetes mellitus with unspecified diabetic retinopathy without macular edema: Secondary | ICD-10-CM | POA: Diagnosis not present

## 2019-03-13 DIAGNOSIS — Z794 Long term (current) use of insulin: Secondary | ICD-10-CM | POA: Diagnosis not present

## 2019-03-13 DIAGNOSIS — H54415A Blindness right eye category 5, normal vision left eye: Secondary | ICD-10-CM | POA: Diagnosis not present

## 2019-03-13 DIAGNOSIS — R82998 Other abnormal findings in urine: Secondary | ICD-10-CM | POA: Diagnosis not present

## 2019-03-13 DIAGNOSIS — Z0000 Encounter for general adult medical examination without abnormal findings: Secondary | ICD-10-CM | POA: Diagnosis not present

## 2019-03-13 DIAGNOSIS — E785 Hyperlipidemia, unspecified: Secondary | ICD-10-CM | POA: Diagnosis not present

## 2019-03-13 DIAGNOSIS — Z1331 Encounter for screening for depression: Secondary | ICD-10-CM | POA: Diagnosis not present

## 2019-03-14 DIAGNOSIS — I10 Essential (primary) hypertension: Secondary | ICD-10-CM | POA: Diagnosis not present

## 2019-03-18 ENCOUNTER — Ambulatory Visit: Payer: Medicare Other | Attending: Internal Medicine

## 2019-03-18 DIAGNOSIS — Z23 Encounter for immunization: Secondary | ICD-10-CM | POA: Insufficient documentation

## 2019-03-18 NOTE — Progress Notes (Signed)
   Covid-19 Vaccination [facility]  Name:  Pamela Warner    MRN: [id number] DOB: [birth year]  03/18/2019  [name] was observed post Covid-19 immunization for 15 minutes without incident. She was provided with Vaccine Information Sheet and instruction to access the V-Safe system.   [name] was instructed to call 911 with any severe reactions post vaccine: Marland Kitchen Difficulty breathing  . Swelling of face and throat  . A fast heartbeat  . A bad rash all over body  . Dizziness and weakness   Immunizations Administered    Name Date Dose VIS Date Route   Pfizer COVID-19 Vaccine 03/18/2019  1:56 PM 0.3 mL 12/20/2018 Intramuscular   Manufacturer: [place]   Lot: [id number]   [place]: [id number]

## 2019-03-24 DIAGNOSIS — Z1212 Encounter for screening for malignant neoplasm of rectum: Secondary | ICD-10-CM | POA: Diagnosis not present

## 2019-04-23 DIAGNOSIS — E0836 Diabetes mellitus due to underlying condition with diabetic cataract: Secondary | ICD-10-CM | POA: Diagnosis not present

## 2019-04-23 DIAGNOSIS — H4312 Vitreous hemorrhage, left eye: Secondary | ICD-10-CM | POA: Diagnosis not present

## 2019-04-23 DIAGNOSIS — H40052 Ocular hypertension, left eye: Secondary | ICD-10-CM | POA: Diagnosis not present

## 2019-04-23 DIAGNOSIS — H401130 Primary open-angle glaucoma, bilateral, stage unspecified: Secondary | ICD-10-CM | POA: Diagnosis not present

## 2019-04-23 DIAGNOSIS — E103593 Type 1 diabetes mellitus with proliferative diabetic retinopathy without macular edema, bilateral: Secondary | ICD-10-CM | POA: Diagnosis not present

## 2019-04-23 DIAGNOSIS — H1820 Unspecified corneal edema: Secondary | ICD-10-CM | POA: Diagnosis not present

## 2019-04-23 DIAGNOSIS — H01021 Squamous blepharitis right upper eyelid: Secondary | ICD-10-CM | POA: Diagnosis not present

## 2019-04-23 DIAGNOSIS — E08311 Diabetes mellitus due to underlying condition with unspecified diabetic retinopathy with macular edema: Secondary | ICD-10-CM | POA: Diagnosis not present

## 2019-04-23 DIAGNOSIS — H44001 Unspecified purulent endophthalmitis, right eye: Secondary | ICD-10-CM | POA: Diagnosis not present

## 2019-07-18 DIAGNOSIS — Z1231 Encounter for screening mammogram for malignant neoplasm of breast: Secondary | ICD-10-CM | POA: Diagnosis not present

## 2019-09-17 DIAGNOSIS — E663 Overweight: Secondary | ICD-10-CM | POA: Diagnosis not present

## 2019-09-17 DIAGNOSIS — I10 Essential (primary) hypertension: Secondary | ICD-10-CM | POA: Diagnosis not present

## 2019-09-17 DIAGNOSIS — E10319 Type 1 diabetes mellitus with unspecified diabetic retinopathy without macular edema: Secondary | ICD-10-CM | POA: Diagnosis not present

## 2019-09-17 DIAGNOSIS — E1039 Type 1 diabetes mellitus with other diabetic ophthalmic complication: Secondary | ICD-10-CM | POA: Diagnosis not present

## 2019-10-29 DIAGNOSIS — E08311 Diabetes mellitus due to underlying condition with unspecified diabetic retinopathy with macular edema: Secondary | ICD-10-CM | POA: Diagnosis not present

## 2019-10-29 DIAGNOSIS — H44001 Unspecified purulent endophthalmitis, right eye: Secondary | ICD-10-CM | POA: Diagnosis not present

## 2019-10-29 DIAGNOSIS — H401130 Primary open-angle glaucoma, bilateral, stage unspecified: Secondary | ICD-10-CM | POA: Diagnosis not present

## 2019-10-29 DIAGNOSIS — H4312 Vitreous hemorrhage, left eye: Secondary | ICD-10-CM | POA: Diagnosis not present

## 2019-10-29 DIAGNOSIS — E103593 Type 1 diabetes mellitus with proliferative diabetic retinopathy without macular edema, bilateral: Secondary | ICD-10-CM | POA: Diagnosis not present

## 2019-10-29 DIAGNOSIS — H40052 Ocular hypertension, left eye: Secondary | ICD-10-CM | POA: Diagnosis not present

## 2019-10-29 DIAGNOSIS — H01021 Squamous blepharitis right upper eyelid: Secondary | ICD-10-CM | POA: Diagnosis not present

## 2019-10-29 DIAGNOSIS — E0836 Diabetes mellitus due to underlying condition with diabetic cataract: Secondary | ICD-10-CM | POA: Diagnosis not present

## 2019-12-22 DIAGNOSIS — Z23 Encounter for immunization: Secondary | ICD-10-CM | POA: Diagnosis not present

## 2020-03-10 DIAGNOSIS — E785 Hyperlipidemia, unspecified: Secondary | ICD-10-CM | POA: Diagnosis not present

## 2020-03-10 DIAGNOSIS — E10319 Type 1 diabetes mellitus with unspecified diabetic retinopathy without macular edema: Secondary | ICD-10-CM | POA: Diagnosis not present

## 2020-03-17 DIAGNOSIS — Z794 Long term (current) use of insulin: Secondary | ICD-10-CM | POA: Diagnosis not present

## 2020-03-17 DIAGNOSIS — R82998 Other abnormal findings in urine: Secondary | ICD-10-CM | POA: Diagnosis not present

## 2020-03-17 DIAGNOSIS — E10319 Type 1 diabetes mellitus with unspecified diabetic retinopathy without macular edema: Secondary | ICD-10-CM | POA: Diagnosis not present

## 2020-03-17 DIAGNOSIS — R413 Other amnesia: Secondary | ICD-10-CM | POA: Diagnosis not present

## 2020-03-17 DIAGNOSIS — I10 Essential (primary) hypertension: Secondary | ICD-10-CM | POA: Diagnosis not present

## 2020-03-17 DIAGNOSIS — E663 Overweight: Secondary | ICD-10-CM | POA: Diagnosis not present

## 2020-03-17 DIAGNOSIS — Z1212 Encounter for screening for malignant neoplasm of rectum: Secondary | ICD-10-CM | POA: Diagnosis not present

## 2020-03-17 DIAGNOSIS — E785 Hyperlipidemia, unspecified: Secondary | ICD-10-CM | POA: Diagnosis not present

## 2020-03-17 DIAGNOSIS — Z0000 Encounter for general adult medical examination without abnormal findings: Secondary | ICD-10-CM | POA: Diagnosis not present

## 2020-03-31 DIAGNOSIS — E0836 Diabetes mellitus due to underlying condition with diabetic cataract: Secondary | ICD-10-CM | POA: Diagnosis not present

## 2020-03-31 DIAGNOSIS — H401130 Primary open-angle glaucoma, bilateral, stage unspecified: Secondary | ICD-10-CM | POA: Diagnosis not present

## 2020-03-31 DIAGNOSIS — H1820 Unspecified corneal edema: Secondary | ICD-10-CM | POA: Diagnosis not present

## 2020-03-31 DIAGNOSIS — H4312 Vitreous hemorrhage, left eye: Secondary | ICD-10-CM | POA: Diagnosis not present

## 2020-03-31 DIAGNOSIS — H44001 Unspecified purulent endophthalmitis, right eye: Secondary | ICD-10-CM | POA: Diagnosis not present

## 2020-03-31 DIAGNOSIS — E08311 Diabetes mellitus due to underlying condition with unspecified diabetic retinopathy with macular edema: Secondary | ICD-10-CM | POA: Diagnosis not present

## 2020-03-31 DIAGNOSIS — H01021 Squamous blepharitis right upper eyelid: Secondary | ICD-10-CM | POA: Diagnosis not present

## 2020-03-31 DIAGNOSIS — E103593 Type 1 diabetes mellitus with proliferative diabetic retinopathy without macular edema, bilateral: Secondary | ICD-10-CM | POA: Diagnosis not present

## 2020-05-15 DIAGNOSIS — Z23 Encounter for immunization: Secondary | ICD-10-CM | POA: Diagnosis not present

## 2020-07-28 DIAGNOSIS — Z20822 Contact with and (suspected) exposure to covid-19: Secondary | ICD-10-CM | POA: Diagnosis not present

## 2020-08-23 IMAGING — CT CT HEART MORP W/ CTA COR W/ SCORE W/ CA W/CM &/OR W/O CM
4 of 7 series · 8 of 20 positions shown, 9 images · IV contrast (APPLIED)
Comparison: None.

Addendum:
EXAM:
OVER-READ INTERPRETATION  CT CHEST

The following report is an over-read performed by radiologist Dr.
Lkw Tiger [[email or web address]] on 01/22/2018. This over-read
does not include interpretation of cardiac or coronary anatomy or
pathology. The coronary CTA interpretation by the cardiologist is
attached.
HISTORY: Dx chest pain
Cardiac/Coronary  CT
TECHNIQUE: The patient was scanned on a Siemens Force scanner.
PROTOCOL: A 120 kV prospective scan was triggered in the descending thoracic
aorta at 111 HU's. Axial non-contrast 3 mm slices were carried out
through the heart. The data set was analyzed on a dedicated work
station and scored using the Agatson method. Gantry rotation speed
was 250 msecs and collimation was .6 mm. No beta blockade and 0.8 mg
of sl NTG was given. The 3D data set was reconstructed in 5%
intervals of the 67-82 % of the R-R cycle. Diastolic phases were
analyzed on a dedicated work station using MPR, MIP and VRT modes.
The patient received 80 cc of contrast.

[Series 6: best diast 73 % · axial · 0.30mm/px · z∈[+1032,+1072]mm · 2 of 301 slices shown, 3 images]
[im 101/301  vessel]
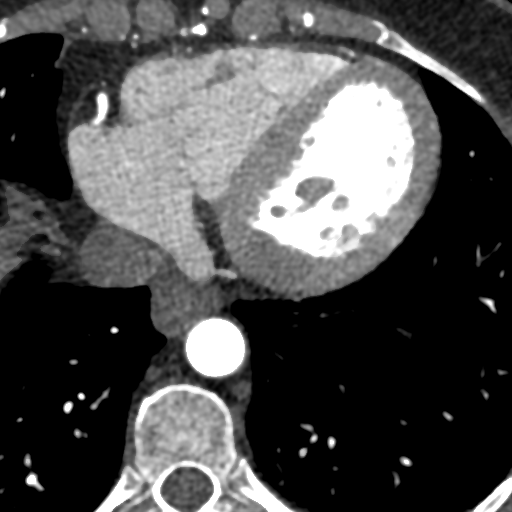
[im 101/301  lung]
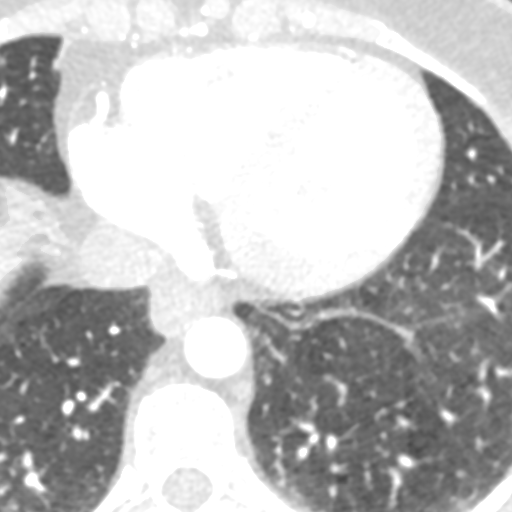
[im 201/301  vessel]
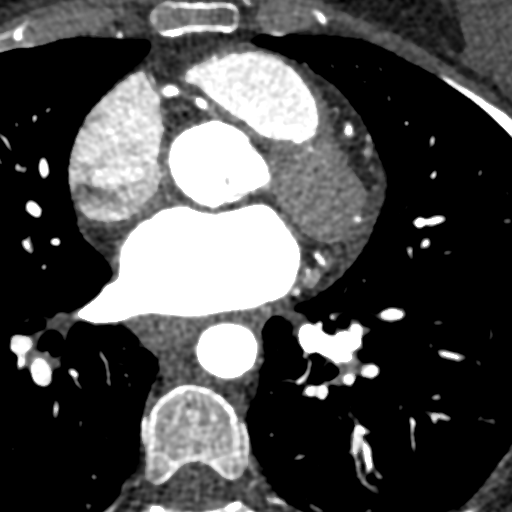

[Series 7: best syst 41 % · axial · 0.30mm/px · z∈[+1032,+1072]mm · 2 of 301 slices shown]
[im 101/301  vessel]
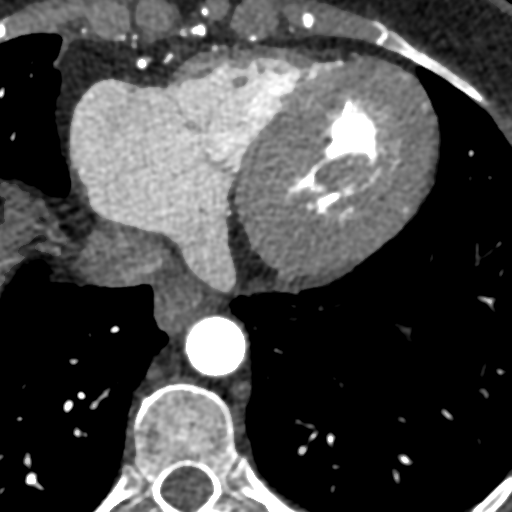
[im 201/301  vessel]
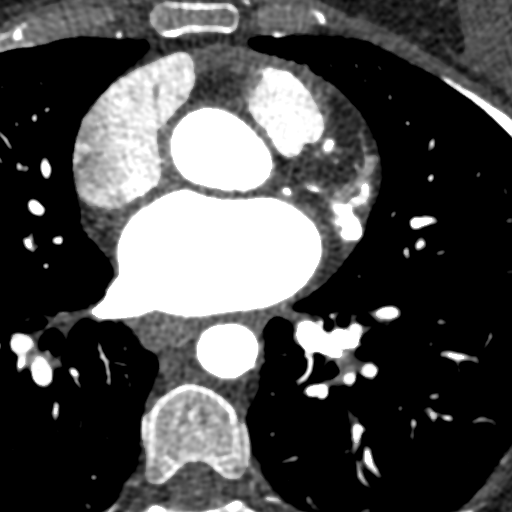

[Series 8: ts diast sharp 73 % · axial · 0.30mm/px · z∈[+1032,+1072]mm · 2 of 301 slices shown]
[im 101/301  lung]
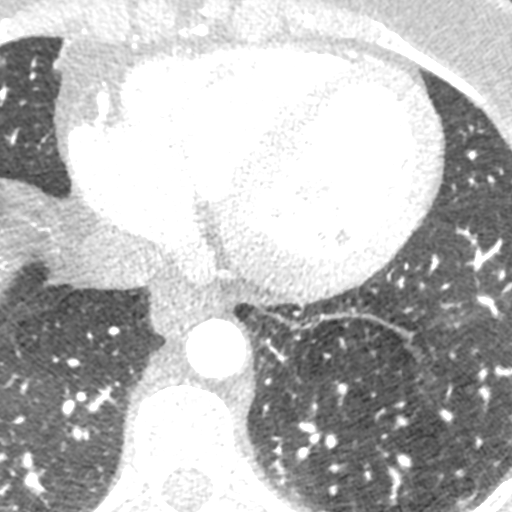
[im 201/301  lung]
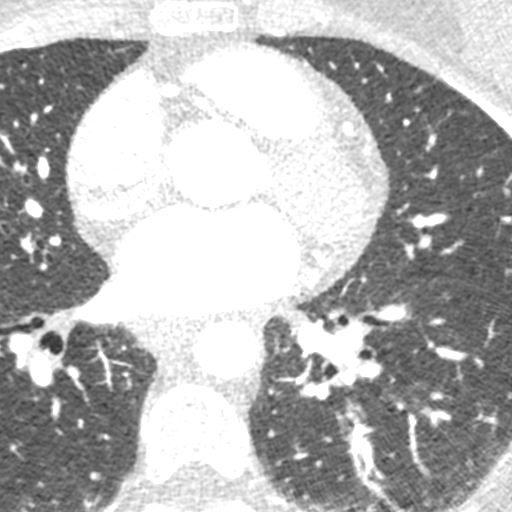

[Series 9: ts syst sharp 41 % · axial · 0.30mm/px · z∈[+1032,+1072]mm · 2 of 301 slices shown]
[im 101/301  lung]
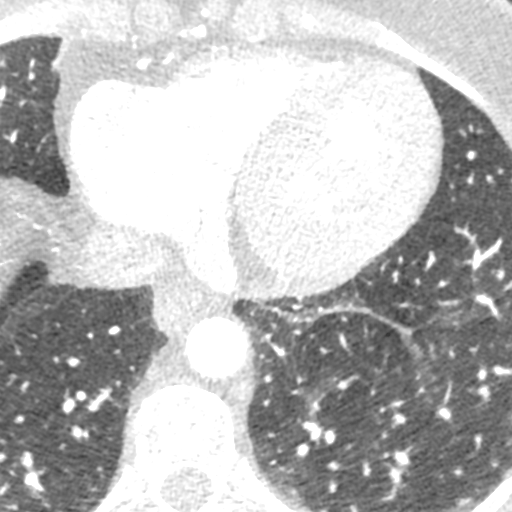
[im 201/301  lung]
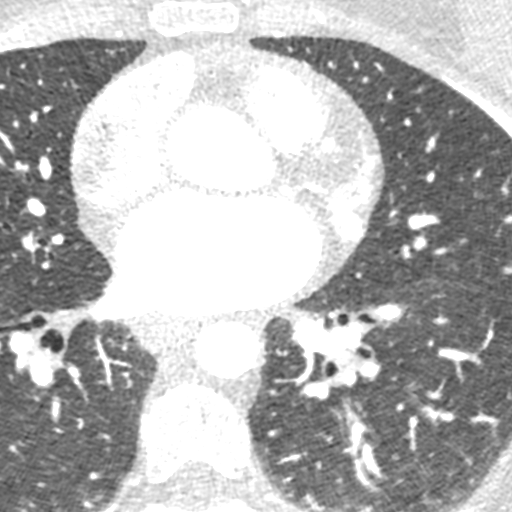

[8 of 20 positions shown; findings below may reference images not displayed]

FINDINGS: Vascular: Heart is normal size. Visualized aorta is normal caliber.
Scattered descending aortic calcifications.

Mediastinum/Nodes: No adenopathy in the lower mediastinum or hila.
There appears to be mild diffuse esophageal wall thickening which
suggests esophagitis.

Lungs/Pleura: No confluent opacity or effusion.

Upper Abdomen: Enhancement of an area within the posterior right
hepatic lobe measuring up to 1.3 cm. 1.0 cm enhancing lesion within
the left hepatic lobe. Nonspecific.

Musculoskeletal: Chest wall soft tissues are unremarkable. No acute
bony abnormality.
IMPRESSION: Mild diffuse esophageal wall thickening could reflect esophagitis.
Recommend clinical correlation.

1.3 cm enhancing lesion in the posterior right hepatic lobe with
cm enhancing lesion in the left hepatic lobe. These are nonspecific.
This could reflect flash filling of hemangiomas. This could be
further evaluated with right upper quadrant ultrasound.
FINDINGS: Coronary calcium score: The patient's coronary artery calcium score
is 139, which places the patient in the 75th percentile.

Coronary arteries: Normal coronary origins.  Right dominance.

Tortuous coronary arteries.

Right Coronary Artery: Atherosclerotic plaque with minimal stenosis
in proximal RCA (<25% stenosis). Mixed atherosclerotic plaque with
mild stenosis in the proximal RCA (25-49% stenosis). Minimal
scattered plaque in the remainder of the RCA (<25% stenosis).

Left Main Coronary Artery: Minimal atherosclerotic plaque in the
LMCA (<25% stenosis.)

Left Anterior Descending Coronary Artery: Mild, ostial LAD
atherosclerotic plaque (25-49% stenosis). Minimal scattered plaque
in remainder of LAD (<25% stenosis).

Left Circumflex Artery: Mild, tubular plaque in the proximal
circumflex artery (25-49% stenosis). Patent OM.

Aorta:  Normal size.  No calcifications.  No dissection.

Aortic Valve: No calcifications.

Other findings:

Normal pulmonary vein drainage into the left atrium.

Normal left atrial appendage without a thrombus.

Normal size of the pulmonary artery.

Severe biatrial chamber enlargement.
IMPRESSION: 1. The patient's coronary artery calcium score is 139, which places
the patient in the 75th percentile for age and sex matched peers.

2. Normal coronary origin with right dominance.

3. Mild CAD, CADRADS = 2.

*** End of Addendum ***

## 2020-08-25 DIAGNOSIS — E0836 Diabetes mellitus due to underlying condition with diabetic cataract: Secondary | ICD-10-CM | POA: Diagnosis not present

## 2020-08-25 DIAGNOSIS — H401130 Primary open-angle glaucoma, bilateral, stage unspecified: Secondary | ICD-10-CM | POA: Diagnosis not present

## 2020-08-25 DIAGNOSIS — E08311 Diabetes mellitus due to underlying condition with unspecified diabetic retinopathy with macular edema: Secondary | ICD-10-CM | POA: Diagnosis not present

## 2020-08-25 DIAGNOSIS — H4312 Vitreous hemorrhage, left eye: Secondary | ICD-10-CM | POA: Diagnosis not present

## 2020-08-25 DIAGNOSIS — H44001 Unspecified purulent endophthalmitis, right eye: Secondary | ICD-10-CM | POA: Diagnosis not present

## 2020-08-25 DIAGNOSIS — H1820 Unspecified corneal edema: Secondary | ICD-10-CM | POA: Diagnosis not present

## 2020-08-25 DIAGNOSIS — E103593 Type 1 diabetes mellitus with proliferative diabetic retinopathy without macular edema, bilateral: Secondary | ICD-10-CM | POA: Diagnosis not present

## 2020-09-20 DIAGNOSIS — I10 Essential (primary) hypertension: Secondary | ICD-10-CM | POA: Diagnosis not present

## 2020-09-20 DIAGNOSIS — Z794 Long term (current) use of insulin: Secondary | ICD-10-CM | POA: Diagnosis not present

## 2020-09-20 DIAGNOSIS — E663 Overweight: Secondary | ICD-10-CM | POA: Diagnosis not present

## 2020-09-20 DIAGNOSIS — E1039 Type 1 diabetes mellitus with other diabetic ophthalmic complication: Secondary | ICD-10-CM | POA: Diagnosis not present

## 2020-09-20 DIAGNOSIS — H54415A Blindness right eye category 5, normal vision left eye: Secondary | ICD-10-CM | POA: Diagnosis not present

## 2020-09-20 DIAGNOSIS — E10319 Type 1 diabetes mellitus with unspecified diabetic retinopathy without macular edema: Secondary | ICD-10-CM | POA: Diagnosis not present

## 2020-11-22 DIAGNOSIS — H40022 Open angle with borderline findings, high risk, left eye: Secondary | ICD-10-CM | POA: Diagnosis not present

## 2020-11-22 DIAGNOSIS — H18513 Endothelial corneal dystrophy, bilateral: Secondary | ICD-10-CM | POA: Diagnosis not present

## 2020-11-22 DIAGNOSIS — E103513 Type 1 diabetes mellitus with proliferative diabetic retinopathy with macular edema, bilateral: Secondary | ICD-10-CM | POA: Diagnosis not present

## 2021-02-07 DIAGNOSIS — H40022 Open angle with borderline findings, high risk, left eye: Secondary | ICD-10-CM | POA: Diagnosis not present

## 2021-03-14 DIAGNOSIS — E785 Hyperlipidemia, unspecified: Secondary | ICD-10-CM | POA: Diagnosis not present

## 2021-03-14 DIAGNOSIS — E10319 Type 1 diabetes mellitus with unspecified diabetic retinopathy without macular edema: Secondary | ICD-10-CM | POA: Diagnosis not present

## 2021-03-14 DIAGNOSIS — I10 Essential (primary) hypertension: Secondary | ICD-10-CM | POA: Diagnosis not present

## 2021-03-21 DIAGNOSIS — Z1212 Encounter for screening for malignant neoplasm of rectum: Secondary | ICD-10-CM | POA: Diagnosis not present

## 2021-03-21 DIAGNOSIS — R82998 Other abnormal findings in urine: Secondary | ICD-10-CM | POA: Diagnosis not present

## 2021-03-21 DIAGNOSIS — K219 Gastro-esophageal reflux disease without esophagitis: Secondary | ICD-10-CM | POA: Diagnosis not present

## 2021-03-21 DIAGNOSIS — E10319 Type 1 diabetes mellitus with unspecified diabetic retinopathy without macular edema: Secondary | ICD-10-CM | POA: Diagnosis not present

## 2021-03-21 DIAGNOSIS — E663 Overweight: Secondary | ICD-10-CM | POA: Diagnosis not present

## 2021-03-21 DIAGNOSIS — I10 Essential (primary) hypertension: Secondary | ICD-10-CM | POA: Diagnosis not present

## 2021-03-21 DIAGNOSIS — H54415A Blindness right eye category 5, normal vision left eye: Secondary | ICD-10-CM | POA: Diagnosis not present

## 2021-03-21 DIAGNOSIS — Z1331 Encounter for screening for depression: Secondary | ICD-10-CM | POA: Diagnosis not present

## 2021-03-21 DIAGNOSIS — Z794 Long term (current) use of insulin: Secondary | ICD-10-CM | POA: Diagnosis not present

## 2021-03-21 DIAGNOSIS — E785 Hyperlipidemia, unspecified: Secondary | ICD-10-CM | POA: Diagnosis not present

## 2021-03-21 DIAGNOSIS — Z0000 Encounter for general adult medical examination without abnormal findings: Secondary | ICD-10-CM | POA: Diagnosis not present

## 2021-03-21 DIAGNOSIS — Z1339 Encounter for screening examination for other mental health and behavioral disorders: Secondary | ICD-10-CM | POA: Diagnosis not present

## 2021-04-06 DIAGNOSIS — M9903 Segmental and somatic dysfunction of lumbar region: Secondary | ICD-10-CM | POA: Diagnosis not present

## 2021-04-06 DIAGNOSIS — M5136 Other intervertebral disc degeneration, lumbar region: Secondary | ICD-10-CM | POA: Diagnosis not present

## 2021-04-12 DIAGNOSIS — M9903 Segmental and somatic dysfunction of lumbar region: Secondary | ICD-10-CM | POA: Diagnosis not present

## 2021-04-12 DIAGNOSIS — M5136 Other intervertebral disc degeneration, lumbar region: Secondary | ICD-10-CM | POA: Diagnosis not present

## 2021-04-27 DIAGNOSIS — M9903 Segmental and somatic dysfunction of lumbar region: Secondary | ICD-10-CM | POA: Diagnosis not present

## 2021-04-27 DIAGNOSIS — M5136 Other intervertebral disc degeneration, lumbar region: Secondary | ICD-10-CM | POA: Diagnosis not present

## 2021-05-03 DIAGNOSIS — M9903 Segmental and somatic dysfunction of lumbar region: Secondary | ICD-10-CM | POA: Diagnosis not present

## 2021-05-03 DIAGNOSIS — M5136 Other intervertebral disc degeneration, lumbar region: Secondary | ICD-10-CM | POA: Diagnosis not present

## 2021-05-11 DIAGNOSIS — M5136 Other intervertebral disc degeneration, lumbar region: Secondary | ICD-10-CM | POA: Diagnosis not present

## 2021-05-11 DIAGNOSIS — M9903 Segmental and somatic dysfunction of lumbar region: Secondary | ICD-10-CM | POA: Diagnosis not present

## 2021-05-18 DIAGNOSIS — M9903 Segmental and somatic dysfunction of lumbar region: Secondary | ICD-10-CM | POA: Diagnosis not present

## 2021-05-18 DIAGNOSIS — M5136 Other intervertebral disc degeneration, lumbar region: Secondary | ICD-10-CM | POA: Diagnosis not present

## 2021-06-07 DIAGNOSIS — M5136 Other intervertebral disc degeneration, lumbar region: Secondary | ICD-10-CM | POA: Diagnosis not present

## 2021-06-07 DIAGNOSIS — M9903 Segmental and somatic dysfunction of lumbar region: Secondary | ICD-10-CM | POA: Diagnosis not present

## 2021-06-08 DIAGNOSIS — H401130 Primary open-angle glaucoma, bilateral, stage unspecified: Secondary | ICD-10-CM | POA: Diagnosis not present

## 2021-06-08 DIAGNOSIS — E0836 Diabetes mellitus due to underlying condition with diabetic cataract: Secondary | ICD-10-CM | POA: Diagnosis not present

## 2021-06-08 DIAGNOSIS — E08311 Diabetes mellitus due to underlying condition with unspecified diabetic retinopathy with macular edema: Secondary | ICD-10-CM | POA: Diagnosis not present

## 2021-06-08 DIAGNOSIS — H01021 Squamous blepharitis right upper eyelid: Secondary | ICD-10-CM | POA: Diagnosis not present

## 2021-06-08 DIAGNOSIS — H44001 Unspecified purulent endophthalmitis, right eye: Secondary | ICD-10-CM | POA: Diagnosis not present

## 2021-06-08 DIAGNOSIS — H4312 Vitreous hemorrhage, left eye: Secondary | ICD-10-CM | POA: Diagnosis not present

## 2021-06-08 DIAGNOSIS — H1820 Unspecified corneal edema: Secondary | ICD-10-CM | POA: Diagnosis not present

## 2021-06-08 DIAGNOSIS — E103593 Type 1 diabetes mellitus with proliferative diabetic retinopathy without macular edema, bilateral: Secondary | ICD-10-CM | POA: Diagnosis not present

## 2021-06-21 DIAGNOSIS — M5136 Other intervertebral disc degeneration, lumbar region: Secondary | ICD-10-CM | POA: Diagnosis not present

## 2021-06-21 DIAGNOSIS — M9903 Segmental and somatic dysfunction of lumbar region: Secondary | ICD-10-CM | POA: Diagnosis not present

## 2021-08-23 DIAGNOSIS — M5136 Other intervertebral disc degeneration, lumbar region: Secondary | ICD-10-CM | POA: Diagnosis not present

## 2021-08-23 DIAGNOSIS — M9903 Segmental and somatic dysfunction of lumbar region: Secondary | ICD-10-CM | POA: Diagnosis not present

## 2021-08-30 DIAGNOSIS — M9903 Segmental and somatic dysfunction of lumbar region: Secondary | ICD-10-CM | POA: Diagnosis not present

## 2021-08-30 DIAGNOSIS — M5136 Other intervertebral disc degeneration, lumbar region: Secondary | ICD-10-CM | POA: Diagnosis not present

## 2021-09-13 DIAGNOSIS — M5136 Other intervertebral disc degeneration, lumbar region: Secondary | ICD-10-CM | POA: Diagnosis not present

## 2021-09-13 DIAGNOSIS — M9903 Segmental and somatic dysfunction of lumbar region: Secondary | ICD-10-CM | POA: Diagnosis not present

## 2021-09-26 DIAGNOSIS — H54415A Blindness right eye category 5, normal vision left eye: Secondary | ICD-10-CM | POA: Diagnosis not present

## 2021-09-26 DIAGNOSIS — E1039 Type 1 diabetes mellitus with other diabetic ophthalmic complication: Secondary | ICD-10-CM | POA: Diagnosis not present

## 2021-09-26 DIAGNOSIS — E10319 Type 1 diabetes mellitus with unspecified diabetic retinopathy without macular edema: Secondary | ICD-10-CM | POA: Diagnosis not present

## 2021-09-26 DIAGNOSIS — L608 Other nail disorders: Secondary | ICD-10-CM | POA: Diagnosis not present

## 2021-09-26 DIAGNOSIS — I10 Essential (primary) hypertension: Secondary | ICD-10-CM | POA: Diagnosis not present

## 2021-09-26 DIAGNOSIS — E663 Overweight: Secondary | ICD-10-CM | POA: Diagnosis not present

## 2021-09-26 DIAGNOSIS — Z794 Long term (current) use of insulin: Secondary | ICD-10-CM | POA: Diagnosis not present

## 2021-10-24 DIAGNOSIS — Z961 Presence of intraocular lens: Secondary | ICD-10-CM | POA: Diagnosis not present

## 2021-10-24 DIAGNOSIS — Z83518 Family history of other specified eye disorder: Secondary | ICD-10-CM | POA: Diagnosis not present

## 2021-10-24 DIAGNOSIS — H18512 Endothelial corneal dystrophy, left eye: Secondary | ICD-10-CM | POA: Diagnosis not present

## 2021-10-24 DIAGNOSIS — H2142 Pupillary membranes, left eye: Secondary | ICD-10-CM | POA: Diagnosis not present

## 2021-10-24 DIAGNOSIS — H40052 Ocular hypertension, left eye: Secondary | ICD-10-CM | POA: Diagnosis not present

## 2021-11-02 DIAGNOSIS — E103593 Type 1 diabetes mellitus with proliferative diabetic retinopathy without macular edema, bilateral: Secondary | ICD-10-CM | POA: Diagnosis not present

## 2022-03-20 DIAGNOSIS — E10319 Type 1 diabetes mellitus with unspecified diabetic retinopathy without macular edema: Secondary | ICD-10-CM | POA: Diagnosis not present

## 2022-03-20 DIAGNOSIS — I10 Essential (primary) hypertension: Secondary | ICD-10-CM | POA: Diagnosis not present

## 2022-03-20 DIAGNOSIS — K219 Gastro-esophageal reflux disease without esophagitis: Secondary | ICD-10-CM | POA: Diagnosis not present

## 2022-03-20 DIAGNOSIS — E785 Hyperlipidemia, unspecified: Secondary | ICD-10-CM | POA: Diagnosis not present

## 2022-03-20 DIAGNOSIS — R7989 Other specified abnormal findings of blood chemistry: Secondary | ICD-10-CM | POA: Diagnosis not present

## 2022-03-29 DIAGNOSIS — Z1331 Encounter for screening for depression: Secondary | ICD-10-CM | POA: Diagnosis not present

## 2022-03-29 DIAGNOSIS — E785 Hyperlipidemia, unspecified: Secondary | ICD-10-CM | POA: Diagnosis not present

## 2022-03-29 DIAGNOSIS — Z1339 Encounter for screening examination for other mental health and behavioral disorders: Secondary | ICD-10-CM | POA: Diagnosis not present

## 2022-03-29 DIAGNOSIS — E663 Overweight: Secondary | ICD-10-CM | POA: Diagnosis not present

## 2022-03-29 DIAGNOSIS — M5416 Radiculopathy, lumbar region: Secondary | ICD-10-CM | POA: Diagnosis not present

## 2022-03-29 DIAGNOSIS — I10 Essential (primary) hypertension: Secondary | ICD-10-CM | POA: Diagnosis not present

## 2022-03-29 DIAGNOSIS — Z0000 Encounter for general adult medical examination without abnormal findings: Secondary | ICD-10-CM | POA: Diagnosis not present

## 2022-03-29 DIAGNOSIS — E10319 Type 1 diabetes mellitus with unspecified diabetic retinopathy without macular edema: Secondary | ICD-10-CM | POA: Diagnosis not present

## 2022-05-17 DIAGNOSIS — H1820 Unspecified corneal edema: Secondary | ICD-10-CM | POA: Diagnosis not present

## 2022-05-17 DIAGNOSIS — E103593 Type 1 diabetes mellitus with proliferative diabetic retinopathy without macular edema, bilateral: Secondary | ICD-10-CM | POA: Diagnosis not present

## 2022-05-17 DIAGNOSIS — H01021 Squamous blepharitis right upper eyelid: Secondary | ICD-10-CM | POA: Diagnosis not present

## 2022-05-17 DIAGNOSIS — H401130 Primary open-angle glaucoma, bilateral, stage unspecified: Secondary | ICD-10-CM | POA: Diagnosis not present

## 2022-05-17 DIAGNOSIS — H4312 Vitreous hemorrhage, left eye: Secondary | ICD-10-CM | POA: Diagnosis not present

## 2022-05-17 DIAGNOSIS — E08311 Diabetes mellitus due to underlying condition with unspecified diabetic retinopathy with macular edema: Secondary | ICD-10-CM | POA: Diagnosis not present

## 2022-05-17 DIAGNOSIS — H44001 Unspecified purulent endophthalmitis, right eye: Secondary | ICD-10-CM | POA: Diagnosis not present

## 2022-05-17 DIAGNOSIS — H401134 Primary open-angle glaucoma, bilateral, indeterminate stage: Secondary | ICD-10-CM | POA: Diagnosis not present

## 2022-05-17 DIAGNOSIS — E0836 Diabetes mellitus due to underlying condition with diabetic cataract: Secondary | ICD-10-CM | POA: Diagnosis not present

## 2022-07-31 DIAGNOSIS — M9903 Segmental and somatic dysfunction of lumbar region: Secondary | ICD-10-CM | POA: Diagnosis not present

## 2022-07-31 DIAGNOSIS — M5136 Other intervertebral disc degeneration, lumbar region: Secondary | ICD-10-CM | POA: Diagnosis not present

## 2022-08-01 DIAGNOSIS — E871 Hypo-osmolality and hyponatremia: Secondary | ICD-10-CM | POA: Diagnosis not present

## 2022-08-01 DIAGNOSIS — R42 Dizziness and giddiness: Secondary | ICD-10-CM | POA: Diagnosis not present

## 2022-08-01 DIAGNOSIS — I10 Essential (primary) hypertension: Secondary | ICD-10-CM | POA: Diagnosis not present

## 2022-08-03 DIAGNOSIS — M5136 Other intervertebral disc degeneration, lumbar region: Secondary | ICD-10-CM | POA: Diagnosis not present

## 2022-08-03 DIAGNOSIS — M9903 Segmental and somatic dysfunction of lumbar region: Secondary | ICD-10-CM | POA: Diagnosis not present

## 2022-10-04 DIAGNOSIS — H44001 Unspecified purulent endophthalmitis, right eye: Secondary | ICD-10-CM | POA: Diagnosis not present

## 2022-10-04 DIAGNOSIS — E08311 Diabetes mellitus due to underlying condition with unspecified diabetic retinopathy with macular edema: Secondary | ICD-10-CM | POA: Diagnosis not present

## 2022-10-04 DIAGNOSIS — E0836 Diabetes mellitus due to underlying condition with diabetic cataract: Secondary | ICD-10-CM | POA: Diagnosis not present

## 2022-10-04 DIAGNOSIS — H1820 Unspecified corneal edema: Secondary | ICD-10-CM | POA: Diagnosis not present

## 2022-10-04 DIAGNOSIS — E103593 Type 1 diabetes mellitus with proliferative diabetic retinopathy without macular edema, bilateral: Secondary | ICD-10-CM | POA: Diagnosis not present

## 2022-10-04 DIAGNOSIS — H401134 Primary open-angle glaucoma, bilateral, indeterminate stage: Secondary | ICD-10-CM | POA: Diagnosis not present

## 2022-10-04 DIAGNOSIS — H4312 Vitreous hemorrhage, left eye: Secondary | ICD-10-CM | POA: Diagnosis not present

## 2022-10-09 DIAGNOSIS — H54415A Blindness right eye category 5, normal vision left eye: Secondary | ICD-10-CM | POA: Diagnosis not present

## 2022-10-09 DIAGNOSIS — E663 Overweight: Secondary | ICD-10-CM | POA: Diagnosis not present

## 2022-10-09 DIAGNOSIS — E785 Hyperlipidemia, unspecified: Secondary | ICD-10-CM | POA: Diagnosis not present

## 2022-10-09 DIAGNOSIS — E10319 Type 1 diabetes mellitus with unspecified diabetic retinopathy without macular edema: Secondary | ICD-10-CM | POA: Diagnosis not present

## 2022-10-09 DIAGNOSIS — Z794 Long term (current) use of insulin: Secondary | ICD-10-CM | POA: Diagnosis not present

## 2022-10-09 DIAGNOSIS — I10 Essential (primary) hypertension: Secondary | ICD-10-CM | POA: Diagnosis not present

## 2022-10-09 DIAGNOSIS — K219 Gastro-esophageal reflux disease without esophagitis: Secondary | ICD-10-CM | POA: Diagnosis not present

## 2022-11-22 DIAGNOSIS — M9903 Segmental and somatic dysfunction of lumbar region: Secondary | ICD-10-CM | POA: Diagnosis not present

## 2022-11-22 DIAGNOSIS — M5136 Other intervertebral disc degeneration, lumbar region with discogenic back pain only: Secondary | ICD-10-CM | POA: Diagnosis not present

## 2022-12-06 DIAGNOSIS — M5136 Other intervertebral disc degeneration, lumbar region with discogenic back pain only: Secondary | ICD-10-CM | POA: Diagnosis not present

## 2022-12-06 DIAGNOSIS — M9903 Segmental and somatic dysfunction of lumbar region: Secondary | ICD-10-CM | POA: Diagnosis not present

## 2022-12-20 DIAGNOSIS — M9903 Segmental and somatic dysfunction of lumbar region: Secondary | ICD-10-CM | POA: Diagnosis not present

## 2022-12-20 DIAGNOSIS — M5136 Other intervertebral disc degeneration, lumbar region with discogenic back pain only: Secondary | ICD-10-CM | POA: Diagnosis not present

## 2022-12-27 DIAGNOSIS — E0836 Diabetes mellitus due to underlying condition with diabetic cataract: Secondary | ICD-10-CM | POA: Diagnosis not present

## 2022-12-27 DIAGNOSIS — H44001 Unspecified purulent endophthalmitis, right eye: Secondary | ICD-10-CM | POA: Diagnosis not present

## 2022-12-27 DIAGNOSIS — E103593 Type 1 diabetes mellitus with proliferative diabetic retinopathy without macular edema, bilateral: Secondary | ICD-10-CM | POA: Diagnosis not present

## 2022-12-27 DIAGNOSIS — E08311 Diabetes mellitus due to underlying condition with unspecified diabetic retinopathy with macular edema: Secondary | ICD-10-CM | POA: Diagnosis not present

## 2022-12-27 DIAGNOSIS — H01021 Squamous blepharitis right upper eyelid: Secondary | ICD-10-CM | POA: Diagnosis not present

## 2022-12-27 DIAGNOSIS — H4312 Vitreous hemorrhage, left eye: Secondary | ICD-10-CM | POA: Diagnosis not present

## 2022-12-27 DIAGNOSIS — H401130 Primary open-angle glaucoma, bilateral, stage unspecified: Secondary | ICD-10-CM | POA: Diagnosis not present

## 2022-12-27 DIAGNOSIS — H401134 Primary open-angle glaucoma, bilateral, indeterminate stage: Secondary | ICD-10-CM | POA: Diagnosis not present

## 2022-12-27 DIAGNOSIS — H1820 Unspecified corneal edema: Secondary | ICD-10-CM | POA: Diagnosis not present

## 2023-01-08 DIAGNOSIS — M5136 Other intervertebral disc degeneration, lumbar region with discogenic back pain only: Secondary | ICD-10-CM | POA: Diagnosis not present

## 2023-01-08 DIAGNOSIS — M9903 Segmental and somatic dysfunction of lumbar region: Secondary | ICD-10-CM | POA: Diagnosis not present

## 2023-03-14 DIAGNOSIS — Z1231 Encounter for screening mammogram for malignant neoplasm of breast: Secondary | ICD-10-CM | POA: Diagnosis not present

## 2023-03-28 DIAGNOSIS — E785 Hyperlipidemia, unspecified: Secondary | ICD-10-CM | POA: Diagnosis not present

## 2023-03-28 DIAGNOSIS — E10319 Type 1 diabetes mellitus with unspecified diabetic retinopathy without macular edema: Secondary | ICD-10-CM | POA: Diagnosis not present

## 2023-03-28 DIAGNOSIS — I10 Essential (primary) hypertension: Secondary | ICD-10-CM | POA: Diagnosis not present

## 2023-03-28 DIAGNOSIS — Z794 Long term (current) use of insulin: Secondary | ICD-10-CM | POA: Diagnosis not present

## 2023-04-09 DIAGNOSIS — H401134 Primary open-angle glaucoma, bilateral, indeterminate stage: Secondary | ICD-10-CM | POA: Diagnosis not present

## 2023-04-09 DIAGNOSIS — E103593 Type 1 diabetes mellitus with proliferative diabetic retinopathy without macular edema, bilateral: Secondary | ICD-10-CM | POA: Diagnosis not present

## 2023-04-11 DIAGNOSIS — E103593 Type 1 diabetes mellitus with proliferative diabetic retinopathy without macular edema, bilateral: Secondary | ICD-10-CM | POA: Diagnosis not present

## 2023-04-11 DIAGNOSIS — H4312 Vitreous hemorrhage, left eye: Secondary | ICD-10-CM | POA: Diagnosis not present

## 2023-04-25 DIAGNOSIS — I10 Essential (primary) hypertension: Secondary | ICD-10-CM | POA: Diagnosis not present

## 2023-04-25 DIAGNOSIS — H54415A Blindness right eye category 5, normal vision left eye: Secondary | ICD-10-CM | POA: Diagnosis not present

## 2023-04-25 DIAGNOSIS — Z0000 Encounter for general adult medical examination without abnormal findings: Secondary | ICD-10-CM | POA: Diagnosis not present

## 2023-04-25 DIAGNOSIS — Z1339 Encounter for screening examination for other mental health and behavioral disorders: Secondary | ICD-10-CM | POA: Diagnosis not present

## 2023-04-25 DIAGNOSIS — Z794 Long term (current) use of insulin: Secondary | ICD-10-CM | POA: Diagnosis not present

## 2023-04-25 DIAGNOSIS — E109 Type 1 diabetes mellitus without complications: Secondary | ICD-10-CM | POA: Diagnosis not present

## 2023-04-25 DIAGNOSIS — R82998 Other abnormal findings in urine: Secondary | ICD-10-CM | POA: Diagnosis not present

## 2023-04-25 DIAGNOSIS — Z1331 Encounter for screening for depression: Secondary | ICD-10-CM | POA: Diagnosis not present

## 2023-04-25 DIAGNOSIS — K219 Gastro-esophageal reflux disease without esophagitis: Secondary | ICD-10-CM | POA: Diagnosis not present

## 2023-04-25 DIAGNOSIS — F418 Other specified anxiety disorders: Secondary | ICD-10-CM | POA: Diagnosis not present

## 2023-04-25 DIAGNOSIS — E10319 Type 1 diabetes mellitus with unspecified diabetic retinopathy without macular edema: Secondary | ICD-10-CM | POA: Diagnosis not present

## 2023-04-25 DIAGNOSIS — F32A Depression, unspecified: Secondary | ICD-10-CM | POA: Diagnosis not present

## 2023-05-17 DIAGNOSIS — M9903 Segmental and somatic dysfunction of lumbar region: Secondary | ICD-10-CM | POA: Diagnosis not present

## 2023-05-17 DIAGNOSIS — M5136 Other intervertebral disc degeneration, lumbar region with discogenic back pain only: Secondary | ICD-10-CM | POA: Diagnosis not present

## 2023-05-30 DIAGNOSIS — M9903 Segmental and somatic dysfunction of lumbar region: Secondary | ICD-10-CM | POA: Diagnosis not present

## 2023-05-30 DIAGNOSIS — M5136 Other intervertebral disc degeneration, lumbar region with discogenic back pain only: Secondary | ICD-10-CM | POA: Diagnosis not present

## 2023-06-25 DIAGNOSIS — M5136 Other intervertebral disc degeneration, lumbar region with discogenic back pain only: Secondary | ICD-10-CM | POA: Diagnosis not present

## 2023-06-25 DIAGNOSIS — M9903 Segmental and somatic dysfunction of lumbar region: Secondary | ICD-10-CM | POA: Diagnosis not present

## 2023-07-11 DIAGNOSIS — M9903 Segmental and somatic dysfunction of lumbar region: Secondary | ICD-10-CM | POA: Diagnosis not present

## 2023-07-11 DIAGNOSIS — M5136 Other intervertebral disc degeneration, lumbar region with discogenic back pain only: Secondary | ICD-10-CM | POA: Diagnosis not present

## 2023-08-15 DIAGNOSIS — H401134 Primary open-angle glaucoma, bilateral, indeterminate stage: Secondary | ICD-10-CM | POA: Diagnosis not present

## 2023-08-15 DIAGNOSIS — H4312 Vitreous hemorrhage, left eye: Secondary | ICD-10-CM | POA: Diagnosis not present

## 2023-08-15 DIAGNOSIS — E103593 Type 1 diabetes mellitus with proliferative diabetic retinopathy without macular edema, bilateral: Secondary | ICD-10-CM | POA: Diagnosis not present

## 2023-08-15 DIAGNOSIS — H401130 Primary open-angle glaucoma, bilateral, stage unspecified: Secondary | ICD-10-CM | POA: Diagnosis not present

## 2023-08-15 DIAGNOSIS — E0836 Diabetes mellitus due to underlying condition with diabetic cataract: Secondary | ICD-10-CM | POA: Diagnosis not present

## 2023-08-15 DIAGNOSIS — E08311 Diabetes mellitus due to underlying condition with unspecified diabetic retinopathy with macular edema: Secondary | ICD-10-CM | POA: Diagnosis not present

## 2023-08-15 DIAGNOSIS — H44001 Unspecified purulent endophthalmitis, right eye: Secondary | ICD-10-CM | POA: Diagnosis not present

## 2023-08-15 DIAGNOSIS — H1820 Unspecified corneal edema: Secondary | ICD-10-CM | POA: Diagnosis not present

## 2023-11-06 DIAGNOSIS — M9903 Segmental and somatic dysfunction of lumbar region: Secondary | ICD-10-CM | POA: Diagnosis not present

## 2023-11-06 DIAGNOSIS — M5136 Other intervertebral disc degeneration, lumbar region with discogenic back pain only: Secondary | ICD-10-CM | POA: Diagnosis not present

## 2023-11-20 DIAGNOSIS — M9903 Segmental and somatic dysfunction of lumbar region: Secondary | ICD-10-CM | POA: Diagnosis not present

## 2023-11-20 DIAGNOSIS — M5136 Other intervertebral disc degeneration, lumbar region with discogenic back pain only: Secondary | ICD-10-CM | POA: Diagnosis not present
